# Patient Record
Sex: Male | Born: 1985 | Race: Black or African American | Hispanic: No | Marital: Single | Smoking: Never smoker
Health system: Southern US, Community
[De-identification: ages and names within clinical notes are randomized; demographics above are authoritative.]

## PROBLEM LIST (undated history)

## (undated) DIAGNOSIS — D5700 Hb-SS disease with crisis, unspecified: Secondary | ICD-10-CM

## (undated) HISTORY — PX: KNEE SURGERY: SHX244

---

## 2009-11-05 ENCOUNTER — Emergency Department (HOSPITAL_COMMUNITY): Admission: EM | Admit: 2009-11-05 | Discharge: 2009-11-05 | Payer: Self-pay | Admitting: Emergency Medicine

## 2010-08-12 LAB — DIFFERENTIAL
Basophils Absolute: 0 10*3/uL (ref 0.0–0.1)
Eosinophils Relative: 3 % (ref 0–5)
Lymphocytes Relative: 28 % (ref 12–46)
Neutro Abs: 8.8 10*3/uL — ABNORMAL HIGH (ref 1.7–7.7)
Neutrophils Relative %: 61 % (ref 43–77)

## 2010-08-12 LAB — URINALYSIS, ROUTINE W REFLEX MICROSCOPIC
Glucose, UA: NEGATIVE mg/dL
Ketones, ur: NEGATIVE mg/dL
Leukocytes, UA: NEGATIVE
Nitrite: NEGATIVE
Specific Gravity, Urine: 1.009 (ref 1.005–1.030)
Urobilinogen, UA: 1 mg/dL (ref 0.0–1.0)

## 2010-08-12 LAB — COMPREHENSIVE METABOLIC PANEL
AST: 82 U/L — ABNORMAL HIGH (ref 0–37)
Albumin: 4.5 g/dL (ref 3.5–5.2)
BUN: 9 mg/dL (ref 6–23)
CO2: 26 mEq/L (ref 19–32)
Calcium: 9.1 mg/dL (ref 8.4–10.5)
GFR calc non Af Amer: >60 mL/min (ref >60)
Glucose, Bld: 111 mg/dL — ABNORMAL HIGH (ref 70–99)
Potassium: 4 mEq/L (ref 3.5–5.1)
Sodium: 139 mEq/L (ref 135–145)

## 2010-08-12 LAB — CBC
MCHC: 35.3 g/dL (ref 30.0–36.0)
MCV: 99.1 fL (ref 78.0–100.0)
Platelets: 363 10*3/uL (ref 150–400)
RBC: 2.17 MIL/uL — ABNORMAL LOW (ref 4.22–5.81)
RDW: 25.4 % — ABNORMAL HIGH (ref 11.5–15.5)
WBC: 14.5 10*3/uL — ABNORMAL HIGH (ref 4.0–10.5)

## 2010-08-12 LAB — RETICULOCYTES: RBC.: 2.15 MIL/uL — ABNORMAL LOW (ref 4.22–5.81)

## 2010-08-12 LAB — URINE MICROSCOPIC-ADD ON

## 2016-03-23 ENCOUNTER — Emergency Department (HOSPITAL_COMMUNITY): Payer: Medicare Other

## 2016-03-23 ENCOUNTER — Emergency Department (HOSPITAL_COMMUNITY)
Admission: EM | Admit: 2016-03-23 | Discharge: 2016-03-23 | Disposition: A | Discharge status: Home / Self Care | Payer: Medicare Other | Attending: Emergency Medicine | Admitting: Emergency Medicine

## 2016-03-23 ENCOUNTER — Encounter (HOSPITAL_COMMUNITY): Payer: Self-pay

## 2016-03-23 DIAGNOSIS — D57219 Sickle-cell/Hb-C disease with crisis, unspecified: Secondary | ICD-10-CM | POA: Diagnosis not present

## 2016-03-23 DIAGNOSIS — D5700 Hb-SS disease with crisis, unspecified: Secondary | ICD-10-CM

## 2016-03-23 HISTORY — DX: Hb-SS disease with crisis, unspecified: D57.00

## 2016-03-23 LAB — CBC WITH DIFFERENTIAL/PLATELET
Basophils Absolute: 0.1 10*3/uL (ref 0.0–0.1)
Basophils Relative: 1 %
EOS PCT: 7 %
Eosinophils Absolute: 0.9 10*3/uL — ABNORMAL HIGH (ref 0.0–0.7)
HEMATOCRIT: 18.7 % — AB (ref 39.0–52.0)
HEMOGLOBIN: 6.5 g/dL — AB (ref 13.0–17.0)
LYMPHS ABS: 3.6 10*3/uL (ref 0.7–4.0)
LYMPHS PCT: 29 %
MCH: 31.1 pg (ref 26.0–34.0)
MCHC: 34.8 g/dL (ref 30.0–36.0)
MCV: 89.5 fL (ref 78.0–100.0)
MONOS PCT: 11 %
Monocytes Absolute: 1.4 10*3/uL — ABNORMAL HIGH (ref 0.1–1.0)
NEUTROS ABS: 6.5 10*3/uL (ref 1.7–7.7)
Neutrophils Relative %: 52 %
Platelets: 547 10*3/uL — ABNORMAL HIGH (ref 150–400)
RBC: 2.09 MIL/uL — AB (ref 4.22–5.81)
RDW: 22 % — AB (ref 11.5–15.5)
WBC: 12.5 10*3/uL — ABNORMAL HIGH (ref 4.0–10.5)

## 2016-03-23 LAB — COMPREHENSIVE METABOLIC PANEL
ALBUMIN: 3.4 g/dL — AB (ref 3.5–5.0)
ALT: 34 U/L (ref 17–63)
ANION GAP: 8 (ref 5–15)
AST: 71 U/L — AB (ref 15–41)
Alkaline Phosphatase: 65 U/L (ref 38–126)
BUN: 6 mg/dL (ref 6–20)
CHLORIDE: 105 mmol/L (ref 101–111)
CO2: 24 mmol/L (ref 22–32)
Calcium: 8.3 mg/dL — ABNORMAL LOW (ref 8.9–10.3)
Creatinine, Ser: 0.65 mg/dL (ref 0.61–1.24)
GFR calc Af Amer: >60 mL/min (ref >60)
GFR calc non Af Amer: >60 mL/min (ref >60)
GLUCOSE: 100 mg/dL — AB (ref 65–99)
POTASSIUM: 3.5 mmol/L (ref 3.5–5.1)
SODIUM: 137 mmol/L (ref 135–145)
Total Bilirubin: 4.7 mg/dL — ABNORMAL HIGH (ref 0.3–1.2)
Total Protein: 6.1 g/dL — ABNORMAL LOW (ref 6.5–8.1)

## 2016-03-23 LAB — RETICULOCYTES
RBC.: 2.09 MIL/uL — ABNORMAL LOW (ref 4.22–5.81)
Retic Ct Pct: >23 % — ABNORMAL HIGH (ref 0.4–3.1)

## 2016-03-23 MED ORDER — HEPARIN SOD (PORK) LOCK FLUSH 100 UNIT/ML IV SOLN
500.0000 [IU] | Freq: Once | INTRAVENOUS | Status: AC
  Administered 2016-03-23: 500 [IU]
  Filled 2016-03-23: qty 5

## 2016-03-23 MED ORDER — HYDROMORPHONE HCL 2 MG/ML IJ SOLN
2.0000 mg | Freq: Once | INTRAMUSCULAR | Status: AC
  Administered 2016-03-23: 2 mg via INTRAMUSCULAR
  Filled 2016-03-23: qty 1

## 2016-03-23 MED ORDER — HYDROMORPHONE HCL 2 MG/ML IJ SOLN
2.0000 mg | Freq: Once | INTRAMUSCULAR | Status: AC
  Administered 2016-03-23: 2 mg via INTRAVENOUS
  Filled 2016-03-23: qty 1

## 2016-03-23 MED ORDER — DIPHENHYDRAMINE HCL 50 MG/ML IJ SOLN
25.0000 mg | Freq: Once | INTRAMUSCULAR | Status: AC
  Administered 2016-03-23: 25 mg via INTRAVENOUS
  Filled 2016-03-23: qty 1

## 2016-03-23 MED ORDER — HYDROMORPHONE HCL 2 MG/ML IJ SOLN
2.0000 mg | Freq: Once | INTRAMUSCULAR | Status: DC
Start: 2016-03-23 — End: 2016-03-23
  Filled 2016-03-23: qty 1

## 2016-03-23 MED ORDER — SODIUM CHLORIDE 0.9 % IV BOLUS (SEPSIS)
1000.0000 mL | Freq: Once | INTRAVENOUS | Status: AC
  Administered 2016-03-23: 1000 mL via INTRAVENOUS

## 2016-03-23 MED ORDER — KETOROLAC TROMETHAMINE 30 MG/ML IJ SOLN
30.0000 mg | Freq: Once | INTRAMUSCULAR | Status: AC
  Administered 2016-03-23: 30 mg via INTRAVENOUS
  Filled 2016-03-23: qty 1

## 2016-03-23 MED ORDER — HYDROMORPHONE HCL 2 MG/ML IJ SOLN
2.0000 mg | Freq: Once | INTRAMUSCULAR | Status: AC
  Administered 2016-03-23: 2 mg via INTRAVENOUS

## 2016-03-23 NOTE — Discharge Instructions (Signed)
Follow-up with your doctor in [place] when you return home.

## 2016-03-23 NOTE — ED Notes (Signed)
No lab draw,  Pt wants to wait for port access.

## 2016-03-23 NOTE — ED Notes (Signed)
Papers reviewed and patient leaving with minimal pain with family

## 2016-03-23 NOTE — ED Notes (Signed)
Pt needs chest xray for confirmation placement of port. Called xray to make aware need ASAP, reports 1 person in front of him.

## 2016-03-23 NOTE — ED Notes (Signed)
Port-a-cath access by Eleanora NeighborEric B, RN.

## 2016-03-23 NOTE — ED Provider Notes (Signed)
[facility]EM[facility]ENCY DEPT Provider Note   CSN: [id number] Arrival date & time: 03/23/16  1034     History   Chief Complaint Chief Complaint  Patient presents with  . Sickle Cell Pain Crisis    HPI Eric Rivera is a 30 y.o. male.  Patient is a 30 year old male with past medical history of sickle cell disease. He presents for evaluation of pain in his chest, back, and joints he states is a sickle cell crisis. This pain began yesterday and has worsened. He denies any fevers or chills. He denies any difficulty breathing or productive cough.  Patient is here visiting from [place] for a wedding.      Past Medical History:  Diagnosis Date  . Sickle cell crisis (HCC)     There are no active problems to display for this patient.   History reviewed. No pertinent surgical history.     Home Medications    Prior to Admission medications   Not on File    Family History No family history on file.  Social History Social History  Substance Use Topics  . Smoking status: Never Smoker  . Smokeless tobacco: Not on file  . Alcohol use Not on file     Allergies   Review of patient's allergies indicates no known allergies.   Review of Systems Review of Systems  All other systems reviewed and are negative.    Physical Exam Updated Vital Signs BP 125/84 (BP Location: Right Arm)   Pulse 92   Temp 98.5 F (36.9 C) (Oral)   Resp 16   Ht 6\' 4"  (1.93 m)   Wt 205 lb (93 kg)   SpO2 95%   BMI 24.95 kg/m   Physical Exam  Constitutional: He is oriented to person, place, and time. He appears well-developed and well-nourished.  Patient appears anxious and tearful.  HENT:  Head: Normocephalic and atraumatic.  Mouth/Throat: Oropharynx is clear and moist.  Neck: Normal range of motion. Neck supple.  Cardiovascular: Normal rate and regular rhythm.  Exam reveals no friction rub.   No murmur heard. Pulmonary/Chest: Effort normal and breath sounds normal. No respiratory  distress. He has no wheezes. He has no rales.  Abdominal: Soft. Bowel sounds are normal. He exhibits no distension. There is no tenderness.  Musculoskeletal: Normal range of motion. He exhibits no edema.  Neurological: He is alert and oriented to person, place, and time. Coordination normal.  Skin: Skin is warm and dry. He is not diaphoretic.  Nursing note and vitals reviewed.    ED Treatments / Results  Labs (all labs ordered are listed, but only abnormal results are displayed) Labs Reviewed  COMPREHENSIVE METABOLIC PANEL  CBC WITH DIFFERENTIAL/PLATELET  RETICULOCYTES    EKG  EKG Interpretation None       Radiology No results found.  Procedures Procedures (including critical care time)  Medications Ordered in ED Medications  diphenhydrAMINE (BENADRYL) injection 25 mg (not administered)  HYDROmorphone (DILAUDID) injection 2 mg (not administered)  sodium chloride 0.9 % bolus 1,000 mL (not administered)  HYDROmorphone (DILAUDID) injection 2 mg (not administered)     Initial Impression / Assessment and Plan / ED Course  I have reviewed the triage vital signs and the nursing notes.  Pertinent labs & imaging results that were available during my care of the patient were reviewed by me and considered in my medical decision making (see chart for details).  Clinical Course    Patient feeling better after repeat doses of Dilaudid. His laboratory studies  reveal anemia with a hemoglobin of 6.5 and elevated reticulocyte count. His chest x-ray is clear. He will be discharged, to follow-up with his doctors in [place] where he is from.  Final Clinical Impressions(s) / ED Diagnoses   Final diagnoses:  None    New Prescriptions New Prescriptions   No medications on file     Geoffery Lyonsouglas Maxxon Schwanke, MD 03/23/16 1438

## 2016-03-23 NOTE — ED Triage Notes (Signed)
Patient here with acute pain that started this am due to sickle cell crisis that awoke him from sleep. Here from out of state for wedding. Alert and oriented, no other hx

## 2016-03-23 NOTE — ED Notes (Signed)
Patient transported to X-ray 

## 2016-03-23 NOTE — ED Notes (Signed)
[place]le [place] crisis. [place] a port from [place].

## 2018-04-22 ENCOUNTER — Inpatient Hospital Stay (HOSPITAL_COMMUNITY)
Admission: EM | Admit: 2018-04-22 | Discharge: 2018-04-30 | DRG: 812 | Disposition: A | Discharge status: Home / Self Care | Payer: Medicare (Managed Care) | Attending: Internal Medicine | Admitting: Internal Medicine

## 2018-04-22 ENCOUNTER — Emergency Department (HOSPITAL_COMMUNITY): Payer: Medicare (Managed Care)

## 2018-04-22 ENCOUNTER — Encounter (HOSPITAL_COMMUNITY): Payer: Self-pay | Admitting: *Deleted

## 2018-04-22 ENCOUNTER — Other Ambulatory Visit: Payer: Self-pay

## 2018-04-22 DIAGNOSIS — R079 Chest pain, unspecified: Secondary | ICD-10-CM | POA: Diagnosis present

## 2018-04-22 DIAGNOSIS — Z79899 Other long term (current) drug therapy: Secondary | ICD-10-CM

## 2018-04-22 DIAGNOSIS — K5900 Constipation, unspecified: Secondary | ICD-10-CM | POA: Diagnosis present

## 2018-04-22 DIAGNOSIS — D571 Sickle-cell disease without crisis: Secondary | ICD-10-CM | POA: Diagnosis present

## 2018-04-22 DIAGNOSIS — D5701 Hb-SS disease with acute chest syndrome: Secondary | ICD-10-CM

## 2018-04-22 DIAGNOSIS — E876 Hypokalemia: Secondary | ICD-10-CM | POA: Diagnosis present

## 2018-04-22 DIAGNOSIS — E860 Dehydration: Secondary | ICD-10-CM | POA: Diagnosis present

## 2018-04-22 DIAGNOSIS — R0789 Other chest pain: Secondary | ICD-10-CM | POA: Diagnosis not present

## 2018-04-22 DIAGNOSIS — G894 Chronic pain syndrome: Secondary | ICD-10-CM | POA: Diagnosis present

## 2018-04-22 DIAGNOSIS — L97919 Non-pressure chronic ulcer of unspecified part of right lower leg with unspecified severity: Secondary | ICD-10-CM | POA: Diagnosis present

## 2018-04-22 DIAGNOSIS — D5700 Hb-SS disease with crisis, unspecified: Secondary | ICD-10-CM

## 2018-04-22 DIAGNOSIS — L97929 Non-pressure chronic ulcer of unspecified part of left lower leg with unspecified severity: Secondary | ICD-10-CM | POA: Diagnosis present

## 2018-04-22 DIAGNOSIS — D72829 Elevated white blood cell count, unspecified: Secondary | ICD-10-CM | POA: Diagnosis present

## 2018-04-22 DIAGNOSIS — Z832 Family history of diseases of the blood and blood-forming organs and certain disorders involving the immune mechanism: Secondary | ICD-10-CM

## 2018-04-22 DIAGNOSIS — N179 Acute kidney failure, unspecified: Secondary | ICD-10-CM | POA: Diagnosis present

## 2018-04-22 DIAGNOSIS — D638 Anemia in other chronic diseases classified elsewhere: Secondary | ICD-10-CM | POA: Diagnosis present

## 2018-04-22 LAB — CBC WITH DIFFERENTIAL/PLATELET
Abs Immature Granulocytes: 0.33 10*3/uL — ABNORMAL HIGH (ref 0.00–0.07)
Basophils Absolute: 0.1 10*3/uL (ref 0.0–0.1)
Basophils Relative: 1 %
Eosinophils Absolute: 3.1 10*3/uL — ABNORMAL HIGH (ref 0.0–0.5)
Eosinophils Relative: 15 %
HEMATOCRIT: 15.2 % — AB (ref 39.0–52.0)
Hemoglobin: 4.9 g/dL — CL (ref 13.0–17.0)
Immature Granulocytes: 2 %
Lymphocytes Relative: 18 %
Lymphs Abs: 3.7 10*3/uL (ref 0.7–4.0)
MCH: 30.6 pg (ref 26.0–34.0)
MCHC: 32.2 g/dL (ref 30.0–36.0)
MCV: 95 fL (ref 80.0–100.0)
Monocytes Absolute: 1.2 10*3/uL — ABNORMAL HIGH (ref 0.1–1.0)
Monocytes Relative: 6 %
NEUTROS ABS: 12.5 10*3/uL — AB (ref 1.7–7.7)
Neutrophils Relative %: 58 %
Platelets: 460 10*3/uL — ABNORMAL HIGH (ref 150–400)
RBC: 1.6 MIL/uL — ABNORMAL LOW (ref 4.22–5.81)
RDW: 23 % — AB (ref 11.5–15.5)
WBC: 21.1 10*3/uL — ABNORMAL HIGH (ref 4.0–10.5)
nRBC: 2 % — ABNORMAL HIGH (ref 0.0–0.2)

## 2018-04-22 LAB — I-STAT TROPONIN, ED: Troponin i, poc: 0.03 ng/mL (ref 0.00–0.08)

## 2018-04-22 LAB — COMPREHENSIVE METABOLIC PANEL
ALT: 20 U/L (ref 0–44)
AST: 41 U/L (ref 15–41)
Albumin: 1.8 g/dL — ABNORMAL LOW (ref 3.5–5.0)
Alkaline Phosphatase: 51 U/L (ref 38–126)
Anion gap: 7 (ref 5–15)
BUN: 11 mg/dL (ref 6–20)
CHLORIDE: 114 mmol/L — AB (ref 98–111)
CO2: 19 mmol/L — ABNORMAL LOW (ref 22–32)
Calcium: 6.9 mg/dL — ABNORMAL LOW (ref 8.9–10.3)
Creatinine, Ser: 1.19 mg/dL (ref 0.61–1.24)
GFR calc Af Amer: >60 mL/min (ref >60)
GFR calc non Af Amer: >60 mL/min (ref >60)
GLUCOSE: 103 mg/dL — AB (ref 70–99)
Potassium: 3.2 mmol/L — ABNORMAL LOW (ref 3.5–5.1)
Sodium: 140 mmol/L (ref 135–145)
TOTAL PROTEIN: 4.9 g/dL — AB (ref 6.5–8.1)
Total Bilirubin: 1.8 mg/dL — ABNORMAL HIGH (ref 0.3–1.2)

## 2018-04-22 LAB — RETICULOCYTES
Immature Retic Fract: 49.2 % — ABNORMAL HIGH (ref 2.3–15.9)
RBC.: 1.6 MIL/uL — ABNORMAL LOW (ref 4.22–5.81)
Retic Count, Absolute: 332.3 10*3/uL — ABNORMAL HIGH (ref 19.0–186.0)
Retic Ct Pct: 20.8 % — ABNORMAL HIGH (ref 0.4–3.1)

## 2018-04-22 MED ORDER — HYDROMORPHONE HCL 1 MG/ML IJ SOLN: 2.0000 mg | INTRAMUSCULAR | Status: DC

## 2018-04-22 MED ORDER — DIPHENHYDRAMINE HCL 25 MG PO CAPS
25.0000 mg | ORAL_CAPSULE | ORAL | Status: DC | PRN
  Administered 2018-04-22: 25 mg via ORAL
  Filled 2018-04-22: qty 1

## 2018-04-22 MED ORDER — HYDROMORPHONE HCL 1 MG/ML IJ SOLN
2.0000 mg | INTRAMUSCULAR | Status: AC
  Administered 2018-04-23: 2 mg via INTRAVENOUS
  Filled 2018-04-22: qty 2

## 2018-04-22 MED ORDER — HYDROMORPHONE HCL 1 MG/ML IJ SOLN: 2.0000 mg | INTRAMUSCULAR | Status: AC

## 2018-04-22 MED ORDER — HYDROMORPHONE HCL 1 MG/ML IJ SOLN
2.0000 mg | INTRAMUSCULAR | Status: AC
  Administered 2018-04-22: 2 mg via INTRAVENOUS
  Filled 2018-04-22: qty 2

## 2018-04-22 MED ORDER — HYDROMORPHONE HCL 1 MG/ML IJ SOLN
2.0000 mg | INTRAMUSCULAR | Status: DC
  Administered 2018-04-23: 2 mg via INTRAVENOUS
  Filled 2018-04-22: qty 2

## 2018-04-22 MED ORDER — IOPAMIDOL (ISOVUE-370) INJECTION 76%
INTRAVENOUS | Status: AC
  Filled 2018-04-22: qty 100

## 2018-04-22 MED ORDER — PROMETHAZINE HCL 25 MG PO TABS: 25.0000 mg | ORAL_TABLET | ORAL | Status: DC | PRN

## 2018-04-22 MED ORDER — SODIUM CHLORIDE 0.45 % IV SOLN
INTRAVENOUS | Status: DC
  Administered 2018-04-22: 150 mL/h via INTRAVENOUS

## 2018-04-22 MED ORDER — IOPAMIDOL (ISOVUE-370) INJECTION 76%
100.0000 mL | Freq: Once | INTRAVENOUS | Status: AC | PRN
  Administered 2018-04-22: 100 mL via INTRAVENOUS

## 2018-04-22 MED ORDER — KETOROLAC TROMETHAMINE 30 MG/ML IJ SOLN
30.0000 mg | Freq: Once | INTRAMUSCULAR | Status: AC
  Administered 2018-04-23: 30 mg via INTRAVENOUS
  Filled 2018-04-22: qty 1

## 2018-04-22 NOTE — ED Provider Notes (Signed)
[facility] EMERGENCY DEPARTMENT Provider Note   CSN: [id number] Arrival date & time: 04/22/18  2200     History   Chief Complaint Chief Complaint  Patient presents with  . Chest Pain  . Sickle Cell Pain Crisis    HPI Eric Rivera is a 32 y.o. male.  Patient presents to the emergency department with a chief complaint of chest pain and shortness of breath.  He is accompanied by his parents.  They were traveling from [place].  Patient has a history of sickle cell disease.  Mother reports that he has sickle cell crisis a couple of times a month.  She states that the onset of this episode was sudden.  She states that he felt like he had a low-grade fever earlier today and gave him some Advil.  When she went to recheck on him he was crying in the bed complaining of chest pain or shortness of breath.  Denies any history of PE or DVT.  He is not anticoagulated.  He denies any pain in his joints.  He states that he has had some slight cough.  The history is provided by the patient. No language interpreter was used.    Past Medical History:  Diagnosis Date  . Sickle cell crisis (HCC)     There are no active problems to display for this patient.   History reviewed. No pertinent surgical history.      Home Medications    Prior to Admission medications   Not on File    Family History No family history on file.  Social History Social History   Tobacco Use  . Smoking status: Never Smoker  Substance Use Topics  . Alcohol use: Not on file  . Drug use: Not on file     Allergies   Patient has no known allergies.   Review of Systems Review of Systems  All other systems reviewed and are negative.    Physical Exam Updated Vital Signs BP (!) 142/76 (BP Location: Right Arm)   Pulse (!) 103   Temp 100.3 F (37.9 C) (Oral)   Resp (!) 24   Ht 6\' 2"  (1.88 m)   Wt 102.1 kg   BMI 28.89 kg/m   Physical Exam  Constitutional: He is oriented to  person, place, and time. He appears well-developed and well-nourished.  Appears uncomfortable  HENT:  Head: Normocephalic and atraumatic.  Eyes: Pupils are equal, round, and reactive to light. Conjunctivae and EOM are normal. Right eye exhibits no discharge. Left eye exhibits no discharge. No scleral icterus.  Neck: Normal range of motion. Neck supple. No JVD present.  Cardiovascular: Normal rate, regular rhythm and normal heart sounds. Exam reveals no gallop and no friction rub.  No murmur heard. Pulmonary/Chest: Effort normal and breath sounds normal. No respiratory distress. He has no wheezes. He has no rales. He exhibits no tenderness.  Lung sounds are clear  Abdominal: Soft. He exhibits no distension and no mass. There is no tenderness. There is no rebound and no guarding.  Musculoskeletal: Normal range of motion. He exhibits no edema or tenderness.  Neurological: He is alert and oriented to person, place, and time.  Skin: Skin is warm and dry.  Psychiatric: He has a normal mood and affect. His behavior is normal. Judgment and thought content normal.  Nursing note and vitals reviewed.    ED Treatments / Results  Labs (all labs ordered are listed, but only abnormal results are displayed) Labs Reviewed  RETICULOCYTES - Abnormal; Notable for the following components:      Result Value   Retic Ct Pct 20.8 (*)    RBC. 1.60 (*)    Retic Count, Absolute 332.3 (*)    Immature Retic Fract 49.2 (*)    All other components within normal limits  COMPREHENSIVE METABOLIC PANEL - Abnormal; Notable for the following components:   Potassium 3.2 (*)    Chloride 114 (*)    CO2 19 (*)    Glucose, Bld 103 (*)    Calcium 6.9 (*)    Total Protein 4.9 (*)    Albumin 1.8 (*)    Total Bilirubin 1.8 (*)    All other components within normal limits  CBC WITH DIFFERENTIAL/PLATELET - Abnormal; Notable for the following components:   WBC 21.1 (*)    RBC 1.60 (*)    Hemoglobin 4.9 (*)    HCT 15.2  (*)    RDW 23.0 (*)    Platelets 460 (*)    nRBC 2.0 (*)    Neutro Abs 12.5 (*)    Monocytes Absolute 1.2 (*)    Eosinophils Absolute 3.1 (*)    Abs Immature Granulocytes 0.33 (*)    All other components within normal limits  HIV ANTIBODY (ROUTINE TESTING W REFLEX)  CBC  CREATININE, SERUM  LACTATE DEHYDROGENASE  I-STAT TROPONIN, ED  TYPE AND SCREEN  ABO/RH  PREPARE RBC (CROSSMATCH)    EKG EKG Interpretation  Date/Time:  Thursday April 22 2018 22:05:56 EST Ventricular Rate:  104 PR Interval:    QRS Duration: 93 QT Interval:  338 QTC Calculation: 445 R Axis:   73 Text Interpretation:  Sinus tachycardia Probable left ventricular hypertrophy Nonspecific T abnormalities, inferior leads Confirmed by Geoffery Lyons ([id number]) on 04/22/2018 10:39:12 PM   Radiology Ct Angio Chest Pe W And/or Wo Contrast  Result Date: 04/23/2018 CLINICAL DATA:  Chest pain and shortness of breath. High pretest probability for pulmonary embolism. Sickle cell anemia. EXAM: CT ANGIOGRAPHY CHEST WITH CONTRAST TECHNIQUE: Multidetector CT imaging of the chest was performed using the standard protocol during bolus administration of intravenous contrast. Multiplanar CT image reconstructions and MIPs were obtained to evaluate the vascular anatomy. CONTRAST:  ISOVUE-370 IOPAMIDOL (ISOVUE-370) INJECTION 76% COMPARISON:  None FINDINGS: Cardiovascular: Somewhat suboptimal opacification of pulmonary arteries noted, however no pulmonary emboli identified. No evidence of thoracic aortic dissection or aneurysm. Mild-to-moderate cardiomegaly. Mediastinum/Nodes: Mild mediastinal lymphadenopathy is seen in the right paratracheal region and AP window. Largest lymph node measures 1.6 cm in the AP window on image 49/5. No hilar lymphadenopathy identified. Lungs/Pleura: No pulmonary mass, infiltrate, or effusion. Upper abdomen: No acute findings. Musculoskeletal: No suspicious bone lesions identified. Review of the MIP images  confirms the above findings. IMPRESSION: No evidence of pulmonary embolism or active lung disease. Mild mediastinal lymphadenopathy, which is nonspecific. Recommend continued follow-up by chest CT in 3-6 months. This recommendation follows ACR consensus guidelines: Managing Incidental Findings on Thoracic CT: Mediastinal and Cardiovascular Findings. A White Paper of the ACR Incidental Findings Committee. J Am Coll Radiol. 2018; 15: 9562-1308. Mild-to-moderate cardiomegaly. Electronically Signed   By: Myles Rosenthal M.D.   On: 04/23/2018 00:29   Dg Chest Port 1 View  Result Date: 04/22/2018 CLINICAL DATA:  Chest pain and shortness of breath. EXAM: PORTABLE CHEST 1 VIEW COMPARISON:  Frontal and lateral views 05/23/2016 FINDINGS: Left chest port with tip in the mid SVC. Cardiomegaly with question progression from prior exam versus differences in technique. Perihilar atelectasis and mild  bronchial thickening. No confluent airspace disease. No pleural effusion or pneumothorax. No acute osseous abnormalities. IMPRESSION: 1. Cardiomegaly with question of progression from prior exam versus differences in technique. 2. Perihilar atelectasis and bronchial thickening. Electronically Signed   By: Narda Rutherford M.D.   On: 04/22/2018 22:26    Procedures Procedures (including critical care time) CRITICAL CARE Performed by: Roxy Horseman  Sickle cell crisis, multiple re-evaluations, concern for acute chest syndrome, profound anemia, hemoglobin 4.9, requiring blood transfusion Total critical care time: 49 minutes  Critical care time was exclusive of separately billable procedures and treating other patients.  Critical care was necessary to treat or prevent imminent or life-threatening deterioration.  Critical care was time spent personally by me on the following activities: development of treatment plan with patient and/or surrogate as well as nursing, discussions with consultants, evaluation of patient's  response to treatment, examination of patient, obtaining history from patient or surrogate, ordering and performing treatments and interventions, ordering and review of laboratory studies, ordering and review of radiographic studies, pulse oximetry and re-evaluation of patient's condition.  Medications Ordered in ED Medications  0.45 % sodium chloride infusion (has no administration in time range)  HYDROmorphone (DILAUDID) injection 2 mg (has no administration in time range)    Or  HYDROmorphone (DILAUDID) injection 2 mg (has no administration in time range)  HYDROmorphone (DILAUDID) injection 2 mg (has no administration in time range)    Or  HYDROmorphone (DILAUDID) injection 2 mg (has no administration in time range)  HYDROmorphone (DILAUDID) injection 2 mg (has no administration in time range)    Or  HYDROmorphone (DILAUDID) injection 2 mg (has no administration in time range)  HYDROmorphone (DILAUDID) injection 2 mg (has no administration in time range)    Or  HYDROmorphone (DILAUDID) injection 2 mg (has no administration in time range)  diphenhydrAMINE (BENADRYL) capsule 25-50 mg (has no administration in time range)  promethazine (PHENERGAN) tablet 25 mg (has no administration in time range)     Initial Impression / Assessment and Plan / ED Course  I have reviewed the triage vital signs and the nursing notes.  Pertinent labs & imaging results that were available during my care of the patient were reviewed by me and considered in my medical decision making (see chart for details).     Patient with chest pain shortness of breath.  Noted to be mildly febrile.  Slightly tachycardic.  O2 saturation is 95% on room air.  Patient reports feeling very short of breath and states that his pain is severe.  He is at risk for PE given his recent travel from [place].  Given his sickle cell disease and risk factors, I believe him to be high risk, will proceed with CT imaging of his chest.   Will give pain medicines and fluids, and will reassess.  11:04 PM Patient discussed with Dr. Judd Lien.  Patient family requesting 4mg  doses of dilaudid. Dr. Judd Lien recommends 2 mg doses.  Hemoglobin is 4.9.  I discussed the he would likely benefit from blood transfusion.  Currently, he wishes to think about this, and does not give consent at this moment.  Patient reassessed, still having significant pain.  Agreeable with plan for blood transfusion.  CT negative for PE.  I am still concerned for radioccult infection given increased leukocytosis, mildly elevated temperature, and cough.  Patient discussed with Dr. Toniann Fail, who will admit the patient.  Agrees with plan to start broad-spectrum antibiotics and transfused 2 units.  Final Clinical Impressions(s) /  ED Diagnoses   Final diagnoses:  Sickle cell pain crisis [facility])  Acute chest syndrome [facility])    ED Discharge Orders    None       Roxy Horseman, PA-C 04/23/18 0143    Geoffery Lyons, MD 04/23/18 1806

## 2018-04-22 NOTE — ED Notes (Signed)
Patient transported to CT 

## 2018-04-22 NOTE — ED Triage Notes (Signed)
Pt c/o chest pain and sob, crying in bed, family member helping to answer questions. Pt took ibuprofen x2 without relief. Power port noted to L chest

## 2018-04-23 ENCOUNTER — Encounter (HOSPITAL_COMMUNITY): Payer: Self-pay | Admitting: Internal Medicine

## 2018-04-23 ENCOUNTER — Other Ambulatory Visit: Payer: Self-pay

## 2018-04-23 DIAGNOSIS — L97929 Non-pressure chronic ulcer of unspecified part of left lower leg with unspecified severity: Secondary | ICD-10-CM | POA: Diagnosis present

## 2018-04-23 DIAGNOSIS — E860 Dehydration: Secondary | ICD-10-CM | POA: Diagnosis present

## 2018-04-23 DIAGNOSIS — D571 Sickle-cell disease without crisis: Secondary | ICD-10-CM | POA: Diagnosis present

## 2018-04-23 DIAGNOSIS — K5900 Constipation, unspecified: Secondary | ICD-10-CM | POA: Diagnosis present

## 2018-04-23 DIAGNOSIS — G894 Chronic pain syndrome: Secondary | ICD-10-CM | POA: Diagnosis present

## 2018-04-23 DIAGNOSIS — D72829 Elevated white blood cell count, unspecified: Secondary | ICD-10-CM | POA: Diagnosis present

## 2018-04-23 DIAGNOSIS — R0789 Other chest pain: Secondary | ICD-10-CM | POA: Diagnosis present

## 2018-04-23 DIAGNOSIS — Z79899 Other long term (current) drug therapy: Secondary | ICD-10-CM | POA: Diagnosis not present

## 2018-04-23 DIAGNOSIS — D638 Anemia in other chronic diseases classified elsewhere: Secondary | ICD-10-CM | POA: Diagnosis present

## 2018-04-23 DIAGNOSIS — N179 Acute kidney failure, unspecified: Secondary | ICD-10-CM | POA: Diagnosis present

## 2018-04-23 DIAGNOSIS — Z832 Family history of diseases of the blood and blood-forming organs and certain disorders involving the immune mechanism: Secondary | ICD-10-CM | POA: Diagnosis not present

## 2018-04-23 DIAGNOSIS — R079 Chest pain, unspecified: Secondary | ICD-10-CM | POA: Diagnosis not present

## 2018-04-23 DIAGNOSIS — E876 Hypokalemia: Secondary | ICD-10-CM | POA: Diagnosis present

## 2018-04-23 DIAGNOSIS — D5700 Hb-SS disease with crisis, unspecified: Secondary | ICD-10-CM | POA: Diagnosis present

## 2018-04-23 DIAGNOSIS — D5701 Hb-SS disease with acute chest syndrome: Secondary | ICD-10-CM | POA: Diagnosis present

## 2018-04-23 DIAGNOSIS — L97919 Non-pressure chronic ulcer of unspecified part of right lower leg with unspecified severity: Secondary | ICD-10-CM | POA: Diagnosis present

## 2018-04-23 LAB — TYPE AND SCREEN
ABO/RH(D): O POS
ANTIBODY SCREEN: POSITIVE
DAT, IgG: POSITIVE
Unit division: 0
Unit division: 0

## 2018-04-23 LAB — BPAM RBC
Blood Product Expiration Date: 201912212359
Blood Product Expiration Date: 201912232359
Unit Type and Rh: 5100
Unit Type and Rh: 5100

## 2018-04-23 LAB — HIV ANTIBODY (ROUTINE TESTING W REFLEX): HIV Screen 4th Generation wRfx: NONREACTIVE

## 2018-04-23 LAB — PREPARE RBC (CROSSMATCH)

## 2018-04-23 LAB — CBC
HCT: 17.6 % — ABNORMAL LOW (ref 39.0–52.0)
Hemoglobin: 5.4 g/dL — CL (ref 13.0–17.0)
MCH: 29.3 pg (ref 26.0–34.0)
MCHC: 30.7 g/dL (ref 30.0–36.0)
MCV: 95.7 fL (ref 80.0–100.0)
Platelets: 522 10*3/uL — ABNORMAL HIGH (ref 150–400)
RBC: 1.84 MIL/uL — ABNORMAL LOW (ref 4.22–5.81)
RDW: 23.2 % — ABNORMAL HIGH (ref 11.5–15.5)
WBC: 21.9 10*3/uL — ABNORMAL HIGH (ref 4.0–10.5)
nRBC: 2.3 % — ABNORMAL HIGH (ref 0.0–0.2)

## 2018-04-23 LAB — CREATININE, SERUM
Creatinine, Ser: 1.19 mg/dL (ref 0.61–1.24)
GFR calc non Af Amer: >60 mL/min (ref >60)

## 2018-04-23 LAB — ABO/RH: ABO/RH(D): O POS

## 2018-04-23 LAB — LACTATE DEHYDROGENASE: LDH: 311 U/L — AB (ref 98–192)

## 2018-04-23 MED ORDER — SODIUM CHLORIDE 0.9 % IV SOLN
50.0000 mg | Freq: Once | INTRAVENOUS | Status: AC
  Administered 2018-04-23: 50 mg via INTRAVENOUS
  Filled 2018-04-23: qty 1

## 2018-04-23 MED ORDER — KETOROLAC TROMETHAMINE 15 MG/ML IJ SOLN
15.0000 mg | Freq: Four times a day (QID) | INTRAMUSCULAR | Status: DC
  Administered 2018-04-23: 15 mg via INTRAVENOUS
  Filled 2018-04-23: qty 1

## 2018-04-23 MED ORDER — HYDROMORPHONE 1 MG/ML IV SOLN
INTRAVENOUS | Status: DC
  Administered 2018-04-23: 25 mg via INTRAVENOUS
  Administered 2018-04-23: 30 mg via INTRAVENOUS
  Administered 2018-04-23: 3 mg via INTRAVENOUS
  Administered 2018-04-23: 09:00:00 via INTRAVENOUS
  Administered 2018-04-23: 16 mg via INTRAVENOUS
  Administered 2018-04-23: 7.43 mg via INTRAVENOUS
  Administered 2018-04-24: 5.54 mg via INTRAVENOUS
  Administered 2018-04-24: 9 mg via INTRAVENOUS
  Administered 2018-04-24: 4 mg via INTRAVENOUS
  Administered 2018-04-24: 18:00:00 via INTRAVENOUS
  Administered 2018-04-24: 6 mg via INTRAVENOUS
  Administered 2018-04-24: 7 mg via INTRAVENOUS
  Administered 2018-04-24: 18 mg via INTRAVENOUS
  Administered 2018-04-24: 4.3 mg via INTRAVENOUS
  Administered 2018-04-25: 13:00:00 via INTRAVENOUS
  Administered 2018-04-25: 2 mg via INTRAVENOUS
  Administered 2018-04-25: 19:00:00 via INTRAVENOUS
  Administered 2018-04-26: 10.7 mg via INTRAVENOUS
  Administered 2018-04-26: 18:00:00 via INTRAVENOUS
  Administered 2018-04-26: 13 mg via INTRAVENOUS
  Administered 2018-04-26: 12.5 mg via INTRAVENOUS
  Administered 2018-04-26: 9.3 mg via INTRAVENOUS
  Administered 2018-04-26: 14 mg via INTRAVENOUS
  Administered 2018-04-26 – 2018-04-27 (×2): via INTRAVENOUS
  Administered 2018-04-27: 30 mg via INTRAVENOUS
  Administered 2018-04-27: 15 mg via INTRAVENOUS
  Administered 2018-04-27: 1 mg via INTRAVENOUS
  Filled 2018-04-23 (×16): qty 25

## 2018-04-23 MED ORDER — VITAMIN D 25 MCG (1000 UNIT) PO TABS
2000.0000 [IU] | ORAL_TABLET | Freq: Every day | ORAL | Status: DC
  Administered 2018-04-23 – 2018-04-30 (×8): 2000 [IU] via ORAL
  Filled 2018-04-23 (×8): qty 2

## 2018-04-23 MED ORDER — SODIUM CHLORIDE 0.9% IV SOLUTION: Freq: Once | INTRAVENOUS | Status: DC

## 2018-04-23 MED ORDER — SENNOSIDES-DOCUSATE SODIUM 8.6-50 MG PO TABS
1.0000 | ORAL_TABLET | Freq: Two times a day (BID) | ORAL | Status: DC
  Administered 2018-04-23 – 2018-04-30 (×16): 1 via ORAL
  Filled 2018-04-23 (×16): qty 1

## 2018-04-23 MED ORDER — FOLIC ACID 1 MG PO TABS
1.0000 mg | ORAL_TABLET | Freq: Every day | ORAL | Status: DC
  Administered 2018-04-23 – 2018-04-30 (×8): 1 mg via ORAL
  Filled 2018-04-23 (×8): qty 1

## 2018-04-23 MED ORDER — SODIUM CHLORIDE 0.9% FLUSH
10.0000 mL | INTRAVENOUS | Status: DC | PRN
  Administered 2018-04-27: 10 mL
  Filled 2018-04-23: qty 40

## 2018-04-23 MED ORDER — DIPHENHYDRAMINE HCL 25 MG PO CAPS
25.0000 mg | ORAL_CAPSULE | ORAL | Status: DC | PRN
  Administered 2018-04-23 – 2018-04-28 (×9): 25 mg via ORAL
  Filled 2018-04-23 (×9): qty 1

## 2018-04-23 MED ORDER — VANCOMYCIN HCL 10 G IV SOLR
2000.0000 mg | Freq: Once | INTRAVENOUS | Status: AC
  Administered 2018-04-23: 2000 mg via INTRAVENOUS
  Filled 2018-04-23: qty 2000

## 2018-04-23 MED ORDER — SODIUM CHLORIDE 0.9 % IV SOLN
25.0000 mg | INTRAVENOUS | Status: DC | PRN
  Administered 2018-04-25 – 2018-04-28 (×2): 25 mg via INTRAVENOUS
  Filled 2018-04-23 (×2): qty 0.5
  Filled 2018-04-23: qty 25
  Filled 2018-04-23: qty 0.5

## 2018-04-23 MED ORDER — SODIUM CHLORIDE 0.9% FLUSH
10.0000 mL | INTRAVENOUS | Status: DC | PRN
  Administered 2018-04-28 – 2018-04-30 (×2): 10 mL
  Administered 2018-04-30: 20 mL
  Filled 2018-04-23 (×3): qty 40

## 2018-04-23 MED ORDER — PIPERACILLIN-TAZOBACTAM 3.375 G IVPB
3.3750 g | Freq: Three times a day (TID) | INTRAVENOUS | Status: DC
  Administered 2018-04-23 – 2018-04-26 (×9): 3.375 g via INTRAVENOUS
  Filled 2018-04-23 (×11): qty 50

## 2018-04-23 MED ORDER — SODIUM CHLORIDE 0.9% FLUSH: 9.0000 mL | INTRAVENOUS | Status: DC | PRN

## 2018-04-23 MED ORDER — METHADONE HCL 10 MG PO TABS
10.0000 mg | ORAL_TABLET | Freq: Every day | ORAL | Status: DC
  Administered 2018-04-24 – 2018-04-30 (×7): 10 mg via ORAL
  Filled 2018-04-23 (×7): qty 2

## 2018-04-23 MED ORDER — POLYETHYLENE GLYCOL 3350 17 G PO PACK: 17.0000 g | PACK | Freq: Every day | ORAL | Status: DC | PRN

## 2018-04-23 MED ORDER — ENOXAPARIN SODIUM 40 MG/0.4ML ~~LOC~~ SOLN
40.0000 mg | SUBCUTANEOUS | Status: DC
  Filled 2018-04-23 (×2): qty 0.4

## 2018-04-23 MED ORDER — ADULT MULTIVITAMIN W/MINERALS CH
1.0000 | ORAL_TABLET | Freq: Every day | ORAL | Status: DC
  Administered 2018-04-23 – 2018-04-30 (×8): 1 via ORAL
  Filled 2018-04-23 (×8): qty 1

## 2018-04-23 MED ORDER — POTASSIUM CHLORIDE CRYS ER 20 MEQ PO TBCR
20.0000 meq | EXTENDED_RELEASE_TABLET | Freq: Once | ORAL | Status: AC
  Administered 2018-04-23: 20 meq via ORAL
  Filled 2018-04-23: qty 1

## 2018-04-23 MED ORDER — PIPERACILLIN-TAZOBACTAM 3.375 G IVPB 30 MIN
3.3750 g | Freq: Once | INTRAVENOUS | Status: AC
  Administered 2018-04-23: 3.375 g via INTRAVENOUS
  Filled 2018-04-23: qty 50

## 2018-04-23 MED ORDER — SODIUM CHLORIDE 0.45 % IV SOLN
INTRAVENOUS | Status: DC
  Administered 2018-04-23 – 2018-04-30 (×9): via INTRAVENOUS

## 2018-04-23 MED ORDER — ONDANSETRON HCL 4 MG/2ML IJ SOLN
4.0000 mg | Freq: Four times a day (QID) | INTRAMUSCULAR | Status: DC | PRN
  Administered 2018-04-24 – 2018-04-28 (×7): 4 mg via INTRAVENOUS
  Filled 2018-04-23 (×7): qty 2

## 2018-04-23 MED ORDER — VANCOMYCIN HCL IN DEXTROSE 1-5 GM/200ML-% IV SOLN
1000.0000 mg | Freq: Three times a day (TID) | INTRAVENOUS | Status: DC
  Administered 2018-04-23 – 2018-04-25 (×7): 1000 mg via INTRAVENOUS
  Filled 2018-04-23 (×7): qty 200

## 2018-04-23 MED ORDER — KETOROLAC TROMETHAMINE 15 MG/ML IJ SOLN
30.0000 mg | Freq: Four times a day (QID) | INTRAMUSCULAR | Status: AC
  Administered 2018-04-23 (×2): 30 mg via INTRAVENOUS
  Filled 2018-04-23 (×2): qty 2

## 2018-04-23 MED ORDER — NALOXONE HCL 0.4 MG/ML IJ SOLN: 0.4000 mg | INTRAMUSCULAR | Status: DC | PRN

## 2018-04-23 NOTE — Progress Notes (Signed)
Patient is a 32 year old gentleman who is from [place] with known history of sickle cell disease who is here with family visiting his aunt for the Thanksgiving but developed sickle cell painful crisis.  [facility]  He is still having pain at 8 out of 10 currently on Dilaudid PCA with Toradol and IV fluids.  A review of patient's care everywhere shows he has been admitted multiple times a [facility] [place] [facility].  Patient has chronic pain syndrome and is opiate tolerant.  We will continue with Dilaudid PCA Toradol and his long-acting.  Patient has also been empirically on Zosyn and vancomycin.  He had recent acute chest syndrome in [place] but does not appear to be having similar symptoms here.  We will check cultures in the morning and if there is no positive cultures and if afebrile will discontinue antibiotics.  Goal is to treat patient enough to where he could be functional enough to move on back home.

## 2018-04-23 NOTE — Progress Notes (Signed)
Wasted 2ml Hydromorphone (Dilaudid) 1mg /mL from PCA syringe when replacing.  Witnessed by Celedonio MiyamotoHannah, Charge Nurse.

## 2018-04-23 NOTE — Progress Notes (Signed)
Pharmacy Antibiotic Note  Eric BorerJessie Rivera is a 32 y.o. male admitted on 04/22/2018 with acute chest syndrome of sickle cell disease.  Pharmacy has been consulted for Vancocin and Zosyn dosing.  Plan: Vancomycin 2000mg  x1 then 1000mg  IV every 8 hours.  Goal trough 15-20 mcg/mL. Zosyn 3.375g IV every 8 hours (4-hour infusion).  Height: 6\' 2"  (188 cm) Weight: 229 lb 6.4 oz (104.1 kg) IBW/kg (Calculated) : 82.2  Temp (24hrs), Avg:99.5 F (37.5 C), Min:98.6 F (37 C), Max:100.3 F (37.9 C)  Recent Labs  Lab 04/22/18 2220  WBC 21.1*  CREATININE 1.19    Estimated Creatinine Clearance: 113.6 mL/min (by C-G formula based on SCr of 1.19 mg/dL).    No Known Allergies   Thank you for allowing pharmacy to be a part of this patient's care.  Vernard GamblesVeronda Zia Kanner, PharmD, BCPS  04/23/2018 3:24 AM

## 2018-04-23 NOTE — Consult Note (Signed)
WOC Nurse wound consult note [facility] [phone], father at bedside.  Patient very drowsy, but able to provide information about his lower extremities. Reason for Consult:BLE Wound type: NO wounds present per patient report.  He states he did have wounds on the BLE, used Silvadene for them some time ago, but all areas have healed.  He states he uses a moisturizing lotion on his legs and wraps them in Ace wraps to maintain the healed status. Plan of care:   Allow the patient to perform care for the bilateral lower legs that consists of:  Gentle washing with soap and water, pat dry, apply liberal amounts of Coloplast Sween Cream (pink and white tube in clean utility), then wrap in 4 inch Ace wraps. Thank you for the consult.  Discussed plan of care with the patient and bedside nurse.  WOC nurse will not follow at this time.  Please re-consult the WOC team if needed.  Helmut MusterSherry Dorwin Fitzhenry, RN, MSN, CWOCN, CNS-BC, pager [phone]

## 2018-04-23 NOTE — Care Management Note (Signed)
Case Management Note 6E Cross Coverage  Patient Details  Name: Eric Rivera MRN: [id number] Date of Birth: [birth year]  Subjective/Objective:   [facility], [facility] care                 Action/Plan: PTA pt from home, independent, CM to continue to follow for transition of care needs  Expected Discharge Date:                  Expected Discharge Plan:  Acute to Acute Transfer  In-House Referral:     Discharge planning Services  CM Consult  Post Acute Care Choice:    Choice offered to:     DME Arranged:    DME Agency:     HH Arranged:    HH Agency:     Status of Service:  In process, will continue to follow  If discussed at Long Length of Stay Meetings, dates discussed:    Additional Comments:  Darrold SpanWebster, Denetra Formoso Hall, RN 04/23/2018, 11:05 AM [phone]

## 2018-04-23 NOTE — Progress Notes (Signed)
I called report to Elmarie Shileyiffany, RN to Eric Rivera. She had no further questions. Patient and his dad at bedside an updated. I gave report to [facility]. Patient transferred in same condition as my am assessment. He is in 7/10 pain which is improved per patient from early this am

## 2018-04-23 NOTE — Progress Notes (Signed)
[facility]. He made me aware that it would be quite a delay to get his blood as he is still waiting to hear back fromt he blood bank in [place] due to all of his antibodies

## 2018-04-23 NOTE — H&P (Addendum)
History and Physical    Eric Rivera ZOX:[id number] DOB: [birth year] DOA: 04/22/2018  PCP: Patient, No Pcp Per  Patient coming from: Home.  Chief Complaint: Chest pain and shortness of breath.  HPI: Eric Rivera is a 32 y.o. male with history of sickle cell anemia who is traveling from [place] to [place] for visiting family started developing chest pain and shortness of breath over the last few hours.  Has had mild nonproductive cough with some subjective feeling of fever and chills.  Denies any headache visual symptoms nausea vomiting abdominal pain or diarrhea.  Was admitted last month for sickle cell pain crisis at that time patient received 4 units of PRBC as per the care everywhere notes.  ED Course: In the ER patient was tachycardic mildly febrile and was complaining of pleuritic type of chest pain.  EKG was showing sinus tachycardia troponin was negative.  CT angiogram of the chest was negative for PE it showed some mediastinal lymphadenopathy and cardiomegaly.  Hemoglobin is around 4.9.  In care everywhere patient's baseline hemoglobin is around 6.  2 units of PRBC has been ordered.  Admitted for sickle cell pain crisis with possible acute chest syndrome for which patient has been started on empiric antibiotics.  Labs also showed leukocytosis.  Review of Systems: As per HPI, rest all negative.   Past Medical History:  Diagnosis Date  . Sickle cell crisis [facility])     Past Surgical History:  Procedure Laterality Date  . KNEE SURGERY       reports that he has never smoked. He has never used smokeless tobacco. He reports that he drank alcohol. His drug history is not on file.  No Known Allergies  Family History  Problem Relation Age of Onset  . Sickle cell anemia Brother     Prior to Admission medications   Medication Sig Start Date End Date Taking? Authorizing Provider  Cholecalciferol (VITAMIN D) 50 MCG (2000 UT) tablet Take 2,000 Units by mouth daily. 02/07/18  Yes  [provider]  folic acid (FOLVITE) 1 MG tablet Take 1 mg by mouth daily. 10/23/15  Yes [provider]  furosemide (LASIX) 40 MG tablet Take 40 mg by mouth daily. 03/31/18  Yes [provider]  ibuprofen (ADVIL,MOTRIN) 200 MG tablet Take 400 mg by mouth every 6 (six) hours as needed for moderate pain.   Yes [provider]  methadone (DOLOPHINE) 10 MG tablet Take 10 mg by mouth every 8 (eight) hours as needed. 04/12/18  Yes [provider]  Multiple Vitamin (MULTIVITAMIN WITH MINERALS) TABS tablet Take 1 tablet by mouth daily.   Yes [provider]  oxyCODONE (ROXICODONE) 15 MG immediate release tablet Take 15 mg by mouth every 4 (four) hours as needed. 04/12/18  Yes [provider]    Physical Exam: Vitals:   04/22/18 2245 04/22/18 2300 04/23/18 0008 04/23/18 0015  BP: 139/67 140/81 (!) 158/81 (!) 143/98  Pulse: (!) 102 (!) 104 (!) 117 88  Resp: (!) 25 20 (!) 26 19  Temp:      TempSrc:      SpO2: 92% 95% 92% 94%  Weight:      Height:          Constitutional: Moderately built and nourished. Vitals:   04/22/18 2245 04/22/18 2300 04/23/18 0008 04/23/18 0015  BP: 139/67 140/81 (!) 158/81 (!) 143/98  Pulse: (!) 102 (!) 104 (!) 117 88  Resp: (!) 25 20 (!) 26 19  Temp:  TempSrc:      SpO2: 92% 95% 92% 94%  Weight:      Height:       Eyes: Anicteric no pallor. ENMT: No discharge from the ears eyes nose or mouth. Neck: No mass felt.  No neck rigidity. Respiratory: No rhonchi or crepitations. Cardiovascular: S1-S2 heard no murmurs appreciated. Abdomen: Soft nontender bowel sounds present. Musculoskeletal: Patient wears lower extremity dressing. Skin: Patient with lower extremity dressing. Neurologic: Alert awake oriented to time place and person.  Moves all extremities. Psychiatric: Appears normal.  Normal affect.   Labs on Admission: I have personally reviewed following labs and imaging studies  CBC: Recent  Labs  Lab 04/22/18 2220  WBC 21.1*  NEUTROABS 12.5*  HGB 4.9*  HCT 15.2*  MCV 95.0  PLT 460*   Basic Metabolic Panel: Recent Labs  Lab 04/22/18 2220  NA 140  K 3.2*  CL 114*  CO2 19*  GLUCOSE 103*  BUN 11  CREATININE 1.19  CALCIUM 6.9*   GFR: Estimated Creatinine Clearance: 112.6 mL/min (by C-G formula based on SCr of 1.19 mg/dL). Liver Function Tests: Recent Labs  Lab 04/22/18 2220  AST 41  ALT 20  ALKPHOS 51  BILITOT 1.8*  PROT 4.9*  ALBUMIN 1.8*   No results for input(s): LIPASE, AMYLASE in the last 168 hours. No results for input(s): AMMONIA in the last 168 hours. Coagulation Profile: No results for input(s): INR, PROTIME in the last 168 hours. Cardiac Enzymes: No results for input(s): CKTOTAL, CKMB, CKMBINDEX, TROPONINI in the last 168 hours. BNP (last 3 results) No results for input(s): PROBNP in the last 8760 hours. HbA1C: No results for input(s): HGBA1C in the last 72 hours. CBG: No results for input(s): GLUCAP in the last 168 hours. Lipid Profile: No results for input(s): CHOL, HDL, LDLCALC, TRIG, CHOLHDL, LDLDIRECT in the last 72 hours. Thyroid Function Tests: No results for input(s): TSH, T4TOTAL, FREET4, T3FREE, THYROIDAB in the last 72 hours. Anemia Panel: Recent Labs    04/22/18 2220  RETICCTPCT 20.8*   Urine analysis:    Component Value Date/Time   COLORURINE YELLOW 11/05/2009 1011   APPEARANCEUR CLEAR 11/05/2009 1011   LABSPEC 1.009 11/05/2009 1011   PHURINE 6.5 11/05/2009 1011   GLUCOSEU NEGATIVE 11/05/2009 1011   HGBUR SMALL (A) 11/05/2009 1011   BILIRUBINUR NEGATIVE 11/05/2009 1011   KETONESUR NEGATIVE 11/05/2009 1011   PROTEINUR NEGATIVE 11/05/2009 1011   UROBILINOGEN 1.0 11/05/2009 1011   NITRITE NEGATIVE 11/05/2009 1011   LEUKOCYTESUR NEGATIVE 11/05/2009 1011   Sepsis Labs: @LABRCNTIP (procalcitonin:4,lacticidven:4) )No results found for this or any previous visit (from the past 240 hour(s)).   Radiological Exams on  Admission: Ct Angio Chest Pe W And/or Wo Contrast  Result Date: 04/23/2018 CLINICAL DATA:  Chest pain and shortness of breath. High pretest probability for pulmonary embolism. Sickle cell anemia. EXAM: CT ANGIOGRAPHY CHEST WITH CONTRAST TECHNIQUE: Multidetector CT imaging of the chest was performed using the standard protocol during bolus administration of intravenous contrast. Multiplanar CT image reconstructions and MIPs were obtained to evaluate the vascular anatomy. CONTRAST:  100mL ISOVUE-370 IOPAMIDOL (ISOVUE-370) INJECTION 76% COMPARISON:  None FINDINGS: Cardiovascular: Somewhat suboptimal opacification of pulmonary arteries noted, however no pulmonary emboli identified. No evidence of thoracic aortic dissection or aneurysm. Mild-to-moderate cardiomegaly. Mediastinum/Nodes: Mild mediastinal lymphadenopathy is seen in the right paratracheal region and AP window. Largest lymph node measures 1.6 cm in the AP window on image 49/5. No hilar lymphadenopathy identified. Lungs/Pleura: No pulmonary mass, infiltrate, or effusion. Upper abdomen:  No acute findings. Musculoskeletal: No suspicious bone lesions identified. Review of the MIP images confirms the above findings. IMPRESSION: No evidence of pulmonary embolism or active lung disease. Mild mediastinal lymphadenopathy, which is nonspecific. Recommend continued follow-up by chest CT in 3-6 months. This recommendation follows ACR consensus guidelines: Managing Incidental Findings on Thoracic CT: Mediastinal and Cardiovascular Findings. A White Paper of the ACR Incidental Findings Committee. J Am Coll Radiol. 2018; 15: 1610-9604. Mild-to-moderate cardiomegaly. Electronically Signed   By: Myles Rosenthal M.D.   On: 04/23/2018 00:29   Dg Chest Port 1 View  Result Date: 04/22/2018 CLINICAL DATA:  Chest pain and shortness of breath. EXAM: PORTABLE CHEST 1 VIEW COMPARISON:  Frontal and lateral views 05/23/2016 FINDINGS: Left chest port with tip in the mid SVC.  Cardiomegaly with question progression from prior exam versus differences in technique. Perihilar atelectasis and mild bronchial thickening. No confluent airspace disease. No pleural effusion or pneumothorax. No acute osseous abnormalities. IMPRESSION: 1. Cardiomegaly with question of progression from prior exam versus differences in technique. 2. Perihilar atelectasis and bronchial thickening. Electronically Signed   By: Narda Rutherford M.D.   On: 04/22/2018 22:26    EKG: Independently reviewed.  Sinus tachycardia.  Assessment/Plan Principal Problem:   Sickle cell pain crisis [facility]) Active Problems:   Chest pain   Sickle cell anemia (HCC)    1. Sickle cell pain crisis with possible acute chest syndrome -patient has been placed on weight-based Dilaudid PCA and 2 units of PRBC transfusion has been ordered.  Empiric antibiotics has been started.  Scheduled dose of Toradol.  Monitor and stepdown.  Discussed with pulmonology. 2. Sickle cell anemia for which 2 units of PRBC has been ordered. 3. 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  Will get wound team consult.  Lasix on hold for now.  Closely monitor respiratory status. 4. Mild hypokalemia replace and recheck.  Agree from Lasix use.  Lasix on hold for sickle cell pain crisis.   DVT prophylaxis: Lovenox. Code Status: Full code. Family Communication: Patient's mother. Disposition Plan: Home. Consults called: None.  Discussed with pulmonology. Admission status: Observation.   Eduard Clos MD [facility] Pager [phone].  If 7PM-7AM, please contact night-coverage [email or web address] Password [email or web address]  04/23/2018, 1:39 AM

## 2018-04-23 NOTE — ED Notes (Signed)
Family at bedside. 

## 2018-04-24 DIAGNOSIS — D5701 Hb-SS disease with acute chest syndrome: Secondary | ICD-10-CM

## 2018-04-24 LAB — EXPECTORATED SPUTUM ASSESSMENT W GRAM STAIN, RFLX TO RESP C

## 2018-04-24 LAB — CBC WITH DIFFERENTIAL/PLATELET
Abs Immature Granulocytes: 0.05 10*3/uL (ref 0.00–0.07)
Basophils Absolute: 0.1 10*3/uL (ref 0.0–0.1)
Basophils Relative: 1 %
Eosinophils Absolute: 1.6 10*3/uL — ABNORMAL HIGH (ref 0.0–0.5)
Eosinophils Relative: 12 %
HCT: 19.9 % — ABNORMAL LOW (ref 39.0–52.0)
Hemoglobin: 6.4 g/dL — CL (ref 13.0–17.0)
Immature Granulocytes: 0 %
Lymphocytes Relative: 13 %
Lymphs Abs: 1.7 10*3/uL (ref 0.7–4.0)
MCH: 29.9 pg (ref 26.0–34.0)
MCHC: 32.2 g/dL (ref 30.0–36.0)
MCV: 93 fL (ref 80.0–100.0)
MONO ABS: 1.2 10*3/uL — AB (ref 0.1–1.0)
Monocytes Relative: 9 %
Neutro Abs: 8.2 10*3/uL — ABNORMAL HIGH (ref 1.7–7.7)
Neutrophils Relative %: 65 %
Platelets: 489 10*3/uL — ABNORMAL HIGH (ref 150–400)
RBC: 2.14 MIL/uL — ABNORMAL LOW (ref 4.22–5.81)
RDW: 20.5 % — ABNORMAL HIGH (ref 11.5–15.5)
WBC: 12.7 10*3/uL — ABNORMAL HIGH (ref 4.0–10.5)
nRBC: 2 % — ABNORMAL HIGH (ref 0.0–0.2)

## 2018-04-24 MED ORDER — GUAIFENESIN-DM 100-10 MG/5ML PO SYRP
10.0000 mL | ORAL_SOLUTION | ORAL | Status: DC | PRN
  Administered 2018-04-26: 10 mL via ORAL
  Filled 2018-04-24: qty 10

## 2018-04-24 MED ORDER — ACETAMINOPHEN 500 MG PO TABS
1000.0000 mg | ORAL_TABLET | Freq: Once | ORAL | Status: AC
  Administered 2018-04-24: 1000 mg via ORAL
  Filled 2018-04-24: qty 2

## 2018-04-24 NOTE — Progress Notes (Signed)
Patient blood transfusion was started after Blood bank notified [facility] nurse.  First Fifteen minutes at start of transfusion patient's temp was 100.5.  Dr. Rana SnareBodenheimer was notified and orders received for tylenol.

## 2018-04-24 NOTE — Progress Notes (Signed)
CRITICAL VALUE ALERT  Critical Value:  HGB 6.4  Date & Time Notied:  04/24/18  @ 1407  Provider Notified: Hyman HopesJegede [phone]  Orders Received/Actions taken: waiting for orders

## 2018-04-24 NOTE — Progress Notes (Signed)
Patient ID: Eric Rivera, male   DOB: 10/03/1985, 32 y.o.   MRN: [id number] Subjective: Patient is a 32 year old gentleman who is from [place] with known history of sickle cell disease who is here with family visiting his aunt for the Thanksgiving but developed sickle cell painful crisis, admitted yesterday with low Hb now S/P transfusion of one unit of PRBC. From Care Everywhere, baseline Hb appears to be around 6.   Patient is still in pain, rates his pain at 7-8/10, mostly in his chest. Still coughing out brownish sputum, coughing is associated with pain. Patient currently on antibiotics, had fever on admission but no more. Denies SOB, no nausea or vomiting, no diarrhea.   Objective:  Vital signs in last 24 hours:  Vitals:   04/24/18 0545 04/24/18 0800 04/24/18 1111 04/24/18 1115  BP: 130/70  126/75   Pulse: 86  75   Resp: 15 10 10 14   Temp: 100.3 F (37.9 C)  98.8 F (37.1 C)   TempSrc: Oral  Oral   SpO2: 94% 96% 97% 93%  Weight:      Height:       Intake/Output from previous day:   Intake/Output Summary (Last 24 hours) at 04/24/2018 1159 Last data filed at 04/24/2018 1115 Gross per 24 hour  Intake 2148 ml  Output 1850 ml  Net 298 ml   Physical Exam: General: Alert, awake, oriented x3, in no acute distress.  HEENT: [place]/AT PEERL, EOMI Neck: Trachea midline,  no masses, no thyromegal,y no JVD, no carotid bruit OROPHARYNX:  Moist, No exudate/ erythema/lesions.  Heart: Regular rate and rhythm, without murmurs, rubs, gallops, PMI non-displaced, no heaves or thrills on palpation.  Lungs: Clear to auscultation, no wheezing or rhonchi noted. No increased vocal fremitus resonant to percussion  Abdomen: Soft, nontender, nondistended, positive bowel sounds, no masses no hepatosplenomegaly noted..  Neuro: No focal neurological deficits noted cranial nerves II through XII grossly intact. DTRs 2+ bilaterally upper and lower extremities. Strength 5 out of 5 in bilateral upper and  lower extremities. Musculoskeletal: No warm swelling or erythema around joints, no spinal tenderness noted. Psychiatric: Patient alert and oriented x3, good insight and cognition, good recent to remote recall. Lymph node survey: No cervical axillary or inguinal lymphadenopathy noted.  Lab Results:  Basic Metabolic Panel:    Component Value Date/Time   NA 140 04/22/2018 2220   K 3.2 (L) 04/22/2018 2220   CL 114 (H) 04/22/2018 2220   CO2 19 (L) 04/22/2018 2220   BUN 11 04/22/2018 2220   CREATININE 1.19 04/23/2018 0402   GLUCOSE 103 (H) 04/22/2018 2220   CALCIUM 6.9 (L) 04/22/2018 2220   CBC:    Component Value Date/Time   WBC 21.9 (H) 04/23/2018 0402   HGB 5.4 (LL) 04/23/2018 0402   HCT 17.6 (L) 04/23/2018 0402   PLT 522 (H) 04/23/2018 0402   MCV 95.7 04/23/2018 0402   NEUTROABS 12.5 (H) 04/22/2018 2220   LYMPHSABS 3.7 04/22/2018 2220   MONOABS 1.2 (H) 04/22/2018 2220   EOSABS 3.1 (H) 04/22/2018 2220   BASOSABS 0.1 04/22/2018 2220    No results found for this or any previous visit (from the past 240 hour(s)).  Studies/Results: Ct Angio Chest Pe W And/or Wo Contrast  Result Date: 04/23/2018 CLINICAL DATA:  Chest pain and shortness of breath. High pretest probability for pulmonary embolism. Sickle cell anemia. EXAM: CT ANGIOGRAPHY CHEST WITH CONTRAST TECHNIQUE: Multidetector CT imaging of the chest was performed using the standard protocol during bolus administration of  intravenous contrast. Multiplanar CT image reconstructions and MIPs were obtained to evaluate the vascular anatomy. CONTRAST:  ISOVUE-370 IOPAMIDOL (ISOVUE-370) INJECTION 76% COMPARISON:  None FINDINGS: Cardiovascular: Somewhat suboptimal opacification of pulmonary arteries noted, however no pulmonary emboli identified. No evidence of thoracic aortic dissection or aneurysm. Mild-to-moderate cardiomegaly. Mediastinum/Nodes: Mild mediastinal lymphadenopathy is seen in the right paratracheal region and AP  window. Largest lymph node measures 1.6 cm in the AP window on image 49/5. No hilar lymphadenopathy identified. Lungs/Pleura: No pulmonary mass, infiltrate, or effusion. Upper abdomen: No acute findings. Musculoskeletal: No suspicious bone lesions identified. Review of the MIP images confirms the above findings. IMPRESSION: No evidence of pulmonary embolism or active lung disease. Mild mediastinal lymphadenopathy, which is nonspecific. Recommend continued follow-up by chest CT in 3-6 months. This recommendation follows ACR consensus guidelines: Managing Incidental Findings on Thoracic CT: Mediastinal and Cardiovascular Findings. A White Paper of the ACR Incidental Findings Committee. J Am Coll Radiol. 2018; 15: 9604-5409. Mild-to-moderate cardiomegaly. Electronically Signed   By: Myles Rosenthal M.D.   On: 04/23/2018 00:29   Dg Chest Port 1 View  Result Date: 04/22/2018 CLINICAL DATA:  Chest pain and shortness of breath. EXAM: PORTABLE CHEST 1 VIEW COMPARISON:  Frontal and lateral views 05/23/2016 FINDINGS: Left chest port with tip in the mid SVC. Cardiomegaly with question progression from prior exam versus differences in technique. Perihilar atelectasis and mild bronchial thickening. No confluent airspace disease. No pleural effusion or pneumothorax. No acute osseous abnormalities. IMPRESSION: 1. Cardiomegaly with question of progression from prior exam versus differences in technique. 2. Perihilar atelectasis and bronchial thickening. Electronically Signed   By: Narda Rutherford M.D.   On: 04/22/2018 22:26    Medications: Scheduled Meds: . sodium chloride   Intravenous Once  . cholecalciferol  2,000 Units Oral Daily  . enoxaparin (LOVENOX) injection  40 mg Subcutaneous Q24H  . folic acid  1 mg Oral Daily  . HYDROmorphone   Intravenous Q4H  . methadone  10 mg Oral Daily  . multivitamin with minerals  1 tablet Oral Daily  . senna-docusate  1 tablet Oral BID   Continuous Infusions: . sodium chloride  25 mL/hr at 04/23/18 1503  . diphenhydrAMINE    . piperacillin-tazobactam (ZOSYN)  IV 3.375 g (04/24/18 1119)  . vancomycin Stopped (04/24/18 0957)   PRN Meds:.diphenhydrAMINE, diphenhydrAMINE, guaiFENesin-dextromethorphan, naloxone **AND** sodium chloride flush, ondansetron (ZOFRAN) IV, polyethylene glycol, sodium chloride flush, sodium chloride flush  Assessment/Plan: Principal Problem:   Sickle cell pain crisis (HCC) Active Problems:   Chest pain   Sickle cell anemia (HCC)  1. Hb Sickle Cell Disease with crisis: Continue IVF D5 .45% Saline @ 125 mls/hour, continue weight based Dilaudid PCA, IV Toradol 30 mg Q 6 H, Monitor vitals very closely, Re-evaluate pain scale regularly, 2 L of Oxygen by Carmi. 2. Leukocytosis: Patient on antibiotics for possible ACS Vs URI. Will monitor blood/sputum/urine cultures. 3. Cough: CT Chest showed no evidence of PE or Active Lung Disease. If cultures are negative, will D/C antibiotics. Add Robitussin for expectoration.  4. Sickle Cell Anemia: S/P blood transfusion, Hb is at 6.4, stable. Will continue to monitor. 5. Chronic pain Syndrome: Restart home medications 6. Chronic Wounds - bilateral Lower Limb: No evidence of infection, continue ted os and dressing, hold lasix for now because of crisis and possible dehydration. 7. Hypokalemia: Resolved  Code Status: Full Code Family Communication: N/A Disposition Plan: Not yet ready for discharge  Nickisha Hum  If 7PM-7AM, please contact night-coverage.  04/24/2018, 11:59  AM  LOS: 1 day

## 2018-04-24 NOTE — Plan of Care (Signed)
  Problem: Elimination: Goal: Will not experience complications related to bowel motility Outcome: Progressing   Problem: Safety: Goal: Ability to remain free from injury will improve Outcome: Progressing   Problem: Nutrition: Goal: Adequate nutrition will be maintained Outcome: Progressing   

## 2018-04-25 LAB — CBC WITH DIFFERENTIAL/PLATELET
Abs Immature Granulocytes: 0.05 10*3/uL (ref 0.00–0.07)
Basophils Absolute: 0 10*3/uL (ref 0.0–0.1)
Basophils Relative: 0 %
EOS PCT: 10 %
Eosinophils Absolute: 1.1 10*3/uL — ABNORMAL HIGH (ref 0.0–0.5)
HCT: 17.8 % — ABNORMAL LOW (ref 39.0–52.0)
Hemoglobin: 5.7 g/dL — CL (ref 13.0–17.0)
Immature Granulocytes: 0 %
Lymphocytes Relative: 21 %
Lymphs Abs: 2.4 10*3/uL (ref 0.7–4.0)
MCH: 29.7 pg (ref 26.0–34.0)
MCHC: 32 g/dL (ref 30.0–36.0)
MCV: 92.7 fL (ref 80.0–100.0)
Monocytes Absolute: 1.3 10*3/uL — ABNORMAL HIGH (ref 0.1–1.0)
Monocytes Relative: 12 %
Neutro Abs: 6.5 10*3/uL (ref 1.7–7.7)
Neutrophils Relative %: 57 %
Platelets: 434 10*3/uL — ABNORMAL HIGH (ref 150–400)
RBC: 1.92 MIL/uL — ABNORMAL LOW (ref 4.22–5.81)
RDW: 19.9 % — ABNORMAL HIGH (ref 11.5–15.5)
WBC: 11.4 10*3/uL — ABNORMAL HIGH (ref 4.0–10.5)
nRBC: 1.3 % — ABNORMAL HIGH (ref 0.0–0.2)

## 2018-04-25 MED ORDER — SODIUM CHLORIDE 0.9% IV SOLUTION: Freq: Once | INTRAVENOUS | Status: DC

## 2018-04-25 NOTE — Progress Notes (Signed)
PHARMACY NOTE -  Zosyn  Pharmacy has been assisting with dosing of Zosyn for PNA,acute chest syndrome. Dosage remains stable at Zosyn 3.375gm IV Q8h to be infused over 4hrs and need for further dosage adjustment appears unlikely at present.    Will sign off at this time & monitor peripherally via electronic surveillance software..    *Please de-escalate to cephalosporin +/- Zithromax if continued antibiotics needed.  Please reconsult if a change in clinical status warrants re-evaluation of dosage.  Junita PushMichelle Adrianah Prophete, PharmD, BCPS 04/25/2018@12 :10 PM

## 2018-04-25 NOTE — Progress Notes (Signed)
Patient ID: Eric Rivera, male   DOB: 22-Feb-1986, 32 y.o.   MRN: [id number] Subjective: Patient is a 32 year old gentleman who is from [place] with known history of sickle cell disease who is here with family visiting his aunt for the Thanksgiving but developed sickle cell painful crisis, admitted yesterday with low Hb now S/P transfusion of one unit of PRBC. From Care Everywhere, baseline Hb appears to be around 6 but patient said his Hb baseline is around 7   Today, patient is still in pain, rates his pain at 6-7/10, mostly in his chest and lower limbs. Hb is down to 5.7 today, no fever, chest pain is much better, no nausea, no vomiting, no diarrhea.  All cultures are negative so far.  Objective:  Vital signs in last 24 hours:  Vitals:   04/25/18 0025 04/25/18 0408 04/25/18 0553 04/25/18 1119  BP: 135/67 117/68  (!) 128/56  Pulse: 75 70  76  Resp: 18 16 18 18   Temp: 99 F (37.2 C) 98.6 F (37 C)  99 F (37.2 C)  TempSrc: Oral Oral  Oral  SpO2: 94% 100% 99% 98%  Weight:      Height:       Intake/Output from previous day:  No intake or output data in the 24 hours ending 04/25/18 1227 Physical Exam: General: Alert, awake, oriented x3, in no acute distress.  HEENT: [place]/AT PEERL, EOMI Neck: Trachea midline,  no masses, no thyromegal,y no JVD, no carotid bruit OROPHARYNX:  Moist, No exudate/ erythema/lesions.  Heart: Regular rate and rhythm, without murmurs, rubs, gallops, PMI non-displaced, no heaves or thrills on palpation.  Lungs: Clear to auscultation, no wheezing or rhonchi noted. No increased vocal fremitus resonant to percussion  Abdomen: Soft, nontender, nondistended, positive bowel sounds, no masses no hepatosplenomegaly noted..  Neuro: No focal neurological deficits noted cranial nerves II through XII grossly intact. DTRs 2+ bilaterally upper and lower extremities. Strength 5 out of 5 in bilateral upper and lower extremities. Musculoskeletal: No warm swelling or erythema  around joints, no spinal tenderness noted. Psychiatric: Patient alert and oriented x3, good insight and cognition, good recent to remote recall. Lymph node survey: No cervical axillary or inguinal lymphadenopathy noted.  Lab Results:  Basic Metabolic Panel:    Component Value Date/Time   NA 140 04/22/2018 2220   K 3.2 (L) 04/22/2018 2220   CL 114 (H) 04/22/2018 2220   CO2 19 (L) 04/22/2018 2220   BUN 11 04/22/2018 2220   CREATININE 1.19 04/23/2018 0402   GLUCOSE 103 (H) 04/22/2018 2220   CALCIUM 6.9 (L) 04/22/2018 2220   CBC:    Component Value Date/Time   WBC 11.4 (H) 04/25/2018 0349   HGB 5.7 (LL) 04/25/2018 0349   HCT 17.8 (L) 04/25/2018 0349   PLT 434 (H) 04/25/2018 0349   MCV 92.7 04/25/2018 0349   NEUTROABS 6.5 04/25/2018 0349   LYMPHSABS 2.4 04/25/2018 0349   MONOABS 1.3 (H) 04/25/2018 0349   EOSABS 1.1 (H) 04/25/2018 0349   BASOSABS 0.0 04/25/2018 0349    Recent Results (from the past 240 hour(s))  Culture, blood (routine x 2)     Status: None (Preliminary result)   Collection Time: 04/24/18 12:45 PM  Result Value Ref Range Status   Specimen Description BLOOD RIGHT ANTECUBITAL  Final   Special Requests   Final    BOTTLES DRAWN AEROBIC ONLY Blood Culture results may not be optimal due to an inadequate volume of blood received in culture bottles   Culture  Final    NO GROWTH < 24 HOURS Performed at [facility], [address] [place], [place] [address]    Report Status PENDING  Incomplete  Culture, blood (routine x 2)     Status: None (Preliminary result)   Collection Time: 04/24/18 12:46 PM  Result Value Ref Range Status   Specimen Description   Final    BLOOD LEFT HAND Performed at [facility], [address], [place], [place] [address]    Special Requests   Final    BOTTLES DRAWN AEROBIC ONLY Blood Culture results may not be optimal due to an inadequate volume of blood received in culture bottles   Culture   Final    NO  GROWTH < 24 HOURS Performed at [facility], [address] [place], [place] [address]    Report Status PENDING  Incomplete  Expectorated sputum assessment w rflx to resp cult     Status: None   Collection Time: 04/24/18  5:36 PM  Result Value Ref Range Status   Specimen Description EXPECTORATED SPUTUM  Final   Special Requests NONE  Final   Sputum evaluation   Final    THIS SPECIMEN IS ACCEPTABLE FOR SPUTUM CULTURE Performed at [facility], [address], [place], [place] [address]    Report Status 04/24/2018 FINAL  Final  Culture, respiratory     Status: None (Preliminary result)   Collection Time: 04/24/18  5:36 PM  Result Value Ref Range Status   Specimen Description   Final    EXPECTORATED SPUTUM Performed at [facility], [address], [place], [place] [address]    Special Requests   Final    NONE Reflexed from [id number] Performed at [facility], [address] [place], [place] [address]    Gram Stain NO WBC SEEN NO ORGANISMS SEEN   Final   Culture   Final    CULTURE REINCUBATED FOR BETTER GROWTH Performed at [facility], [address], [place], [place] [address]    Report Status PENDING  Incomplete    Studies/Results: No results found.  Medications: Scheduled Meds: . sodium chloride   Intravenous Once  . cholecalciferol  2,000 Units Oral Daily  . enoxaparin (LOVENOX) injection  40 mg Subcutaneous Q24H  . folic acid  1 mg Oral Daily  . HYDROmorphone   Intravenous Q4H  . methadone  10 mg Oral Daily  . multivitamin with minerals  1 tablet Oral Daily  . senna-docusate  1 tablet Oral BID   Continuous Infusions: . sodium chloride 25 mL/hr at 04/25/18 0601  . diphenhydrAMINE 25 mg (04/25/18 0032)  . piperacillin-tazobactam (ZOSYN)  IV 3.375 g (04/25/18 1212)   PRN Meds:.diphenhydrAMINE, diphenhydrAMINE, guaiFENesin-dextromethorphan, naloxone **AND** sodium chloride flush, ondansetron  (ZOFRAN) IV, polyethylene glycol, sodium chloride flush, sodium chloride flush  Assessment/Plan: Principal Problem:   Sickle cell pain crisis (HCC) Active Problems:   Chest pain   Sickle cell anemia (HCC)   Acute chest syndrome (HCC)  1. Hb Sickle Cell Disease with crisis: Reduce IVF D5 .45% Saline to KVO, continue weight based Dilaudid PCA, IV Toradol 30 mg Q 6 H, Monitor vitals very closely, Re-evaluate pain scale regularly, 2 L of Oxygen by [place]. 2. Leukocytosis: Patient on antibiotics for possible ACS Vs URI. Blood Cx is negative, will D/C Vancomycin, if blood and sputum Cxs are negative x 48 hours, will D/C Zosyn 3. Cough: CT Chest showed no evidence of  PE or Active Lung Disease. If cultures are negative, will D/C antibiotics. Continue Robitussin for expectoration.  4. Sickle Cell Anemia: S/P blood transfusion, Hb is at down to 5.7 today, will transfuse 2 units of PRBC today, continue to monitor. 5. Chronic pain Syndrome: Continue home medications 6. Chronic Wounds - bilateral Lower Limb: No evidence of infection, continue ted os and dressing, hold lasix for now because of crisis and possible dehydration. 7. Hypokalemia: Resolved  Code Status: Full Code Family Communication: N/A Disposition Plan: Not yet ready for discharge  Lorea Kupfer  If 7PM-7AM, please contact night-coverage.  04/25/2018, 12:27 PM  LOS: 2 days

## 2018-04-26 LAB — URINE CULTURE: Culture: NO GROWTH

## 2018-04-26 LAB — CBC WITH DIFFERENTIAL/PLATELET
Abs Immature Granulocytes: 0.04 10*3/uL (ref 0.00–0.07)
BASOS PCT: 0 %
Basophils Absolute: 0 10*3/uL (ref 0.0–0.1)
Eosinophils Absolute: 0.7 10*3/uL — ABNORMAL HIGH (ref 0.0–0.5)
Eosinophils Relative: 7 %
HCT: 19.6 % — ABNORMAL LOW (ref 39.0–52.0)
Hemoglobin: 6.5 g/dL — CL (ref 13.0–17.0)
Immature Granulocytes: 0 %
Lymphocytes Relative: 26 %
Lymphs Abs: 2.5 10*3/uL (ref 0.7–4.0)
MCH: 30 pg (ref 26.0–34.0)
MCHC: 33.2 g/dL (ref 30.0–36.0)
MCV: 90.3 fL (ref 80.0–100.0)
Monocytes Absolute: 1.4 10*3/uL — ABNORMAL HIGH (ref 0.1–1.0)
Monocytes Relative: 14 %
NEUTROS PCT: 53 %
Neutro Abs: 5.2 10*3/uL (ref 1.7–7.7)
Platelets: 344 10*3/uL (ref 150–400)
RBC: 2.17 MIL/uL — ABNORMAL LOW (ref 4.22–5.81)
RDW: 18.7 % — ABNORMAL HIGH (ref 11.5–15.5)
WBC: 9.8 10*3/uL (ref 4.0–10.5)
nRBC: 1.5 % — ABNORMAL HIGH (ref 0.0–0.2)

## 2018-04-26 LAB — COMPREHENSIVE METABOLIC PANEL
ALT: 93 U/L — AB (ref 0–44)
AST: 137 U/L — ABNORMAL HIGH (ref 15–41)
Albumin: 2.1 g/dL — ABNORMAL LOW (ref 3.5–5.0)
Alkaline Phosphatase: 49 U/L (ref 38–126)
Anion gap: 6 (ref 5–15)
BUN: 29 mg/dL — ABNORMAL HIGH (ref 6–20)
CO2: 24 mmol/L (ref 22–32)
CREATININE: 2.76 mg/dL — AB (ref 0.61–1.24)
Calcium: 7.2 mg/dL — ABNORMAL LOW (ref 8.9–10.3)
Chloride: 107 mmol/L (ref 98–111)
GFR calc Af Amer: 33 mL/min — ABNORMAL LOW (ref >60)
GFR calc non Af Amer: 29 mL/min — ABNORMAL LOW (ref >60)
Glucose, Bld: 112 mg/dL — ABNORMAL HIGH (ref 70–99)
Potassium: 4.3 mmol/L (ref 3.5–5.1)
Sodium: 137 mmol/L (ref 135–145)
Total Bilirubin: 1.5 mg/dL — ABNORMAL HIGH (ref 0.3–1.2)
Total Protein: 5.3 g/dL — ABNORMAL LOW (ref 6.5–8.1)

## 2018-04-26 LAB — PREPARE RBC (CROSSMATCH)

## 2018-04-26 MED ORDER — GUAIFENESIN-DM 100-10 MG/5ML PO SYRP
10.0000 mL | ORAL_SOLUTION | ORAL | Status: DC
  Administered 2018-04-26 – 2018-04-30 (×13): 10 mL via ORAL
  Filled 2018-04-26 (×13): qty 10

## 2018-04-26 NOTE — Progress Notes (Signed)
Patient ID: Eric Rivera, male   DOB: [name], 32 y.o.   MRN: [id number] Subjective: Patient is a 32 year old gentleman who is from [place] with known history of sickle cell disease who is here with family visiting his aunt for the Thanksgiving but developed sickle cell painful crisis, admitted yesterday with low Hb now S/P transfusion of one unit of PRBC. From Care Everywhere, baseline Hb appears to be around 6 but patient said his Hb baseline is around 7.  Patient was transfused with 2 units of PRBC yesterday making a total of 3 units of transfusion during this admission. Hb is up to 6.5 today. He claims his pain is much better today but not yet at baseline. All cultures are negative so far. He denies any fever, no chest pain, no SOB, ambulating well.  Objective:  Vital signs in last 24 hours:  Vitals:   04/26/18 0500 04/26/18 0723 04/26/18 0815 04/26/18 1210  BP: 128/73 116/71  134/81  Pulse: 68 65  79  Resp: 15 12 11 17   Temp: 99.3 F (37.4 C) 99 F (37.2 C)  98.9 F (37.2 C)  TempSrc: Oral Oral  Oral  SpO2: 94% 95% 96% 93%  Weight:      Height:        Intake/Output from previous day:   Intake/Output Summary (Last 24 hours) at 04/26/2018 1414 Last data filed at 04/26/2018 0500 Gross per 24 hour  Intake 679 ml  Output 1680 ml  Net -1001 ml    Physical Exam: General: Alert, awake, oriented x3, in no acute distress.  HEENT: [place]/AT PEERL, EOMI Neck: Trachea midline,  no masses, no thyromegal,y no JVD, no carotid bruit OROPHARYNX:  Moist, No exudate/ erythema/lesions.  Heart: Regular rate and rhythm, without murmurs, rubs, gallops, PMI non-displaced, no heaves or thrills on palpation.  Lungs: Clear to auscultation, no wheezing or rhonchi noted. No increased vocal fremitus resonant to percussion  Abdomen: Soft, nontender, nondistended, positive bowel sounds, no masses no hepatosplenomegaly noted..  Neuro: No focal neurological deficits noted cranial nerves II through XII  grossly intact. DTRs 2+ bilaterally upper and lower extremities. Strength 5 out of 5 in bilateral upper and lower extremities. Musculoskeletal: No warm swelling or erythema around joints, no spinal tenderness noted. Psychiatric: Patient alert and oriented x3, good insight and cognition, good recent to remote recall. Lymph node survey: No cervical axillary or inguinal lymphadenopathy noted.  Lab Results:  Basic Metabolic Panel:    Component Value Date/Time   NA 137 04/26/2018 0650   K 4.3 04/26/2018 0650   CL 107 04/26/2018 0650   CO2 24 04/26/2018 0650   BUN 29 (H) 04/26/2018 0650   CREATININE 2.76 (H) 04/26/2018 0650   GLUCOSE 112 (H) 04/26/2018 0650   CALCIUM 7.2 (L) 04/26/2018 0650   CBC:    Component Value Date/Time   WBC 9.8 04/26/2018 0650   HGB 6.5 (LL) 04/26/2018 0650   HCT 19.6 (L) 04/26/2018 0650   PLT 344 04/26/2018 0650   MCV 90.3 04/26/2018 0650   NEUTROABS 5.2 04/26/2018 0650   LYMPHSABS 2.5 04/26/2018 0650   MONOABS 1.4 (H) 04/26/2018 0650   EOSABS 0.7 (H) 04/26/2018 0650   BASOSABS 0.0 04/26/2018 0650    Recent Results (from the past 240 hour(s))  Culture, blood (routine x 2)     Status: None (Preliminary result)   Collection Time: 04/24/18 12:45 PM  Result Value Ref Range Status   Specimen Description BLOOD RIGHT ANTECUBITAL  Final   Special Requests  Final    BOTTLES DRAWN AEROBIC ONLY Blood Culture results may not be optimal due to an inadequate volume of blood received in culture bottles   Culture   Final    NO GROWTH 2 DAYS Performed at [facility], [address], [place], [place] [address]    Report Status PENDING  Incomplete  Culture, blood (routine x 2)     Status: None (Preliminary result)   Collection Time: 04/24/18 12:46 PM  Result Value Ref Range Status   Specimen Description   Final    BLOOD LEFT HAND Performed at [facility], [address], [place], [place] [address]    Special Requests   Final     BOTTLES DRAWN AEROBIC ONLY Blood Culture results may not be optimal due to an inadequate volume of blood received in culture bottles   Culture   Final    NO GROWTH 2 DAYS Performed at [facility], [address] [place], [place] [address]    Report Status PENDING  Incomplete  Culture, Urine     Status: None   Collection Time: 04/24/18  5:36 PM  Result Value Ref Range Status   Specimen Description   Final    URINE, CLEAN CATCH Performed at [facility], [address] [place], [place] [address]    Special Requests   Final    NONE Performed at [facility], [address], [place], [place] [address]    Culture   Final    NO GROWTH Performed at [facility], [address] [place], [place] [address]    Report Status 04/26/2018 FINAL  Final  Expectorated sputum assessment w rflx to resp cult     Status: None   Collection Time: 04/24/18  5:36 PM  Result Value Ref Range Status   Specimen Description EXPECTORATED SPUTUM  Final   Special Requests NONE  Final   Sputum evaluation   Final    THIS SPECIMEN IS ACCEPTABLE FOR SPUTUM CULTURE Performed at [facility], [address] [place], [place] [address]    Report Status 04/24/2018 FINAL  Final  Culture, respiratory     Status: None (Preliminary result)   Collection Time: 04/24/18  5:36 PM  Result Value Ref Range Status   Specimen Description   Final    EXPECTORATED SPUTUM Performed at [facility], [address] [place], [place] [address]    Special Requests   Final    NONE Reflexed from [id number] Performed at [facility], [address] [place], [place] [address]    Gram Stain NO WBC SEEN NO ORGANISMS SEEN   Final   Culture   Final    FEW Consistent with normal respiratory flora. Performed at [facility], [address], [place], [place] [address]    Report Status PENDING  Incomplete     Studies/Results: No results found.  Medications: Scheduled Meds: . sodium chloride   Intravenous Once  . cholecalciferol  2,000 Units Oral Daily  . enoxaparin (LOVENOX) injection  40 mg Subcutaneous Q24H  . folic acid  1 mg Oral Daily  . HYDROmorphone   Intravenous Q4H  . methadone  10 mg Oral Daily  . multivitamin with minerals  1 tablet Oral Daily  . senna-docusate  1 tablet Oral BID   Continuous Infusions: . sodium chloride 150 mL/hr at 04/26/18 0952  . diphenhydrAMINE 25 mg (04/25/18 0032)  PRN Meds:.diphenhydrAMINE, diphenhydrAMINE, guaiFENesin-dextromethorphan, naloxone **AND** sodium chloride flush, ondansetron (ZOFRAN) IV, polyethylene glycol, sodium chloride flush, sodium chloride flush  Assessment/Plan: Principal Problem:   Sickle cell pain crisis (HCC) Active Problems:   Chest pain   Sickle cell anemia (HCC)   Acute chest syndrome (HCC)  1. Hb Sickle Cell Disease with crisis: Restart IVF D5 .45% Saline @ 150 cc/Hr, continue weight based Dilaudid PCA, discontinue IV Toradol due to AKI, Monitor vitals very closely, Re-evaluate pain scale regularly, 2 L of Oxygen by [place]. 2. Acute Kidney Injury: Serum Creatinine is elevated to 2.76, possibly multifactorial, dehydration, Toradol, Vacomycin and more. IVF @ 150cc/hr, D/C Toradol, D/C all antibiotics   3. Leukocytosis: Patient on antibiotics for possible ACS Vs URI. Blood Cx and all cultures are negative, will D/C all antibiotics  4. Cough: CT Chest showed no evidence of PE or Active Lung Disease. Continue Robitussin for expectoration.  5. Sickle Cell Anemia: S/P blood transfusion, Hb is at 6.5 today, will continue to monitor. 6. Chronic pain Syndrome: Continue home medications 7. Chronic Wounds - bilateral Lower Limb: No evidence of infection, continue ted os and dressing, hold lasix for now because of crisis and possible dehydration. 8. Hypokalemia: Resolved  Code Status: Full Code Family Communication:  N/A Disposition Plan: Not yet ready for discharge  Yeira Gulden  If 7PM-7AM, please contact night-coverage.  04/26/2018, 2:14 PM  LOS: 3 days

## 2018-04-27 DIAGNOSIS — D5700 Hb-SS disease with crisis, unspecified: Secondary | ICD-10-CM

## 2018-04-27 DIAGNOSIS — D5701 Hb-SS disease with acute chest syndrome: Principal | ICD-10-CM

## 2018-04-27 DIAGNOSIS — R079 Chest pain, unspecified: Secondary | ICD-10-CM

## 2018-04-27 LAB — BPAM RBC
BLOOD PRODUCT EXPIRATION DATE: 201912232359
Blood Product Expiration Date: 201912212359
Blood Product Expiration Date: 201912262359
ISSUE DATE / TIME: 201911300145
ISSUE DATE / TIME: 201912011518
ISSUE DATE / TIME: 201912020147
UNIT TYPE AND RH: 5100
Unit Type and Rh: 5100
Unit Type and Rh: 5100

## 2018-04-27 LAB — CBC WITH DIFFERENTIAL/PLATELET
ABS IMMATURE GRANULOCYTES: 0.06 10*3/uL (ref 0.00–0.07)
Basophils Absolute: 0.1 10*3/uL (ref 0.0–0.1)
Basophils Relative: 0 %
Eosinophils Absolute: 1.1 10*3/uL — ABNORMAL HIGH (ref 0.0–0.5)
Eosinophils Relative: 9 %
HCT: 18.7 % — ABNORMAL LOW (ref 39.0–52.0)
Hemoglobin: 6.2 g/dL — CL (ref 13.0–17.0)
Immature Granulocytes: 1 %
Lymphocytes Relative: 18 %
Lymphs Abs: 2.3 10*3/uL (ref 0.7–4.0)
MCH: 29.8 pg (ref 26.0–34.0)
MCHC: 33.2 g/dL (ref 30.0–36.0)
MCV: 89.9 fL (ref 80.0–100.0)
Monocytes Absolute: 1.2 10*3/uL — ABNORMAL HIGH (ref 0.1–1.0)
Monocytes Relative: 10 %
Neutro Abs: 8 10*3/uL — ABNORMAL HIGH (ref 1.7–7.7)
Neutrophils Relative %: 62 %
Platelets: 343 10*3/uL (ref 150–400)
RBC: 2.08 MIL/uL — ABNORMAL LOW (ref 4.22–5.81)
RDW: 19.3 % — ABNORMAL HIGH (ref 11.5–15.5)
WBC: 12.7 10*3/uL — ABNORMAL HIGH (ref 4.0–10.5)
nRBC: 1.2 % — ABNORMAL HIGH (ref 0.0–0.2)

## 2018-04-27 LAB — COMPREHENSIVE METABOLIC PANEL
ALBUMIN: 2 g/dL — AB (ref 3.5–5.0)
ALT: 97 U/L — ABNORMAL HIGH (ref 0–44)
AST: 129 U/L — ABNORMAL HIGH (ref 15–41)
Alkaline Phosphatase: 52 U/L (ref 38–126)
Anion gap: 6 (ref 5–15)
BUN: 36 mg/dL — ABNORMAL HIGH (ref 6–20)
CO2: 24 mmol/L (ref 22–32)
Calcium: 7.3 mg/dL — ABNORMAL LOW (ref 8.9–10.3)
Chloride: 109 mmol/L (ref 98–111)
Creatinine, Ser: 2.78 mg/dL — ABNORMAL HIGH (ref 0.61–1.24)
GFR calc Af Amer: 33 mL/min — ABNORMAL LOW (ref >60)
GFR calc non Af Amer: 29 mL/min — ABNORMAL LOW (ref >60)
GLUCOSE: 108 mg/dL — AB (ref 70–99)
Potassium: 4.5 mmol/L (ref 3.5–5.1)
Sodium: 139 mmol/L (ref 135–145)
Total Bilirubin: 1.2 mg/dL (ref 0.3–1.2)
Total Protein: 5.4 g/dL — ABNORMAL LOW (ref 6.5–8.1)

## 2018-04-27 LAB — TYPE AND SCREEN
ABO/RH(D): O POS
Antibody Screen: POSITIVE
DAT, IgG: POSITIVE
Unit division: 0
Unit division: 0
Unit division: 0

## 2018-04-27 LAB — CULTURE, RESPIRATORY

## 2018-04-27 LAB — CULTURE, RESPIRATORY W GRAM STAIN
Culture: NORMAL
Gram Stain: NONE SEEN

## 2018-04-27 MED ORDER — OXYCODONE HCL 5 MG PO TABS: 15.0000 mg | ORAL_TABLET | Freq: Four times a day (QID) | ORAL | Status: DC | PRN

## 2018-04-27 MED ORDER — HYDROMORPHONE 1 MG/ML IV SOLN
INTRAVENOUS | Status: DC
  Filled 2018-04-27: qty 25

## 2018-04-27 MED ORDER — AZITHROMYCIN 250 MG PO TABS
500.0000 mg | ORAL_TABLET | Freq: Every day | ORAL | Status: AC
  Administered 2018-04-27 – 2018-04-29 (×3): 500 mg via ORAL
  Filled 2018-04-27 (×3): qty 2

## 2018-04-27 MED ORDER — ACETAMINOPHEN 500 MG PO TABS
1000.0000 mg | ORAL_TABLET | Freq: Four times a day (QID) | ORAL | Status: DC | PRN
  Administered 2018-04-27: 1000 mg via ORAL

## 2018-04-27 NOTE — Progress Notes (Addendum)
Subjective: Eric Rivera, a 32 year old male with a medical history significant for sickle cell anemia and chronic pain syndrome was admitted and sickle cell crisis.  Patient states that he was visiting family for Thanksgiving and developed sickle cell pain crisis.  Patient is from [place].  Patient is S/P 2 units of PRBCs.  Hemoglobin is only increased to 6.2, which is decreased from yesterday.  Patient states that his pain is worse today being that his PCA was adjusted by night RN and he was only receiving 1 mg/h.  His pain intensity is 7-8/10 primarily to mid chest.  Patient continues to cough persistently.  He endorses productive cough.  Patient afebrile.  He denies headache, shortness of breath, heart palpitations, dysuria, nausea, vomiting, or diarrhea.  Patient is able to ambulate in room without assistance.  Objective:  Vital signs in last 24 hours:  Vitals:   04/27/18 0526 04/27/18 0649 04/27/18 0752 04/27/18 0800  BP: 131/75  130/71   Pulse: 71  66   Resp: 16 12 12 11   Temp: 98.7 F (37.1 C)  99.2 F (37.3 C)   TempSrc: Oral  Oral   SpO2: 94% 95% 95% 96%  Weight:      Height:        Intake/Output from previous day:   Intake/Output Summary (Last 24 hours) at 04/27/2018 1030 Last data filed at 04/27/2018 1000 Gross per 24 hour  Intake 10 ml  Output 2000 ml  Net -1990 ml   Physical Exam  Constitutional: He is oriented to person, place, and time. He appears well-developed and well-nourished. He appears distressed.  Eyes: Pupils are equal, round, and reactive to light. Scleral icterus is present.  Neck: Normal range of motion.  Cardiovascular: Normal rate, regular rhythm and normal heart sounds.  Pulmonary/Chest: Effort normal. No tachypnea. No respiratory distress. He has rhonchi in the right upper field, the right lower field, the left upper field and the left lower field.  Abdominal: Soft. Bowel sounds are normal.  Neurological: He is alert and oriented to person,  place, and time.  Skin: Skin is warm and dry.  Psychiatric: He has a normal mood and affect. His behavior is normal. Judgment and thought content normal.    Lab Results:  Basic Metabolic Panel:    Component Value Date/Time   NA 139 04/27/2018 0532   K 4.5 04/27/2018 0532   CL 109 04/27/2018 0532   CO2 24 04/27/2018 0532   BUN 36 (H) 04/27/2018 0532   CREATININE 2.78 (H) 04/27/2018 0532   GLUCOSE 108 (H) 04/27/2018 0532   CALCIUM 7.3 (L) 04/27/2018 0532   CBC:    Component Value Date/Time   WBC 12.7 (H) 04/27/2018 0532   HGB 6.2 (LL) 04/27/2018 0532   HCT 18.7 (L) 04/27/2018 0532   PLT 343 04/27/2018 0532   MCV 89.9 04/27/2018 0532   NEUTROABS 8.0 (H) 04/27/2018 0532   LYMPHSABS 2.3 04/27/2018 0532   MONOABS 1.2 (H) 04/27/2018 0532   EOSABS 1.1 (H) 04/27/2018 0532   BASOSABS 0.1 04/27/2018 0532    Recent Results (from the past 240 hour(s))  Culture, blood (routine x 2)     Status: None (Preliminary result)   Collection Time: 04/24/18 12:45 PM  Result Value Ref Range Status   Specimen Description BLOOD RIGHT ANTECUBITAL  Final   Special Requests   Final    BOTTLES DRAWN AEROBIC ONLY Blood Culture results may not be optimal due to an inadequate volume of blood received in culture bottles  Culture   Final    NO GROWTH 2 DAYS Performed at [facility], [address] [place], [place] [address]    Report Status PENDING  Incomplete  Culture, blood (routine x 2)     Status: None (Preliminary result)   Collection Time: 04/24/18 12:46 PM  Result Value Ref Range Status   Specimen Description   Final    BLOOD LEFT HAND Performed at [facility], [address] [place], [place] [address]    Special Requests   Final    BOTTLES DRAWN AEROBIC ONLY Blood Culture results may not be optimal due to an inadequate volume of blood received in culture bottles   Culture   Final    NO GROWTH 2 DAYS Performed at [facility], [address]  [place], [place] [address]    Report Status PENDING  Incomplete  Culture, Urine     Status: None   Collection Time: 04/24/18  5:36 PM  Result Value Ref Range Status   Specimen Description   Final    URINE, CLEAN CATCH Performed at [facility], [address] [place], [place] [address]    Special Requests   Final    NONE Performed at [facility], [address] [place], [place] [address]    Culture   Final    NO GROWTH Performed at [facility], [address] [place], [place] [address]    Report Status 04/26/2018 FINAL  Final  Expectorated sputum assessment w rflx to resp cult     Status: None   Collection Time: 04/24/18  5:36 PM  Result Value Ref Range Status   Specimen Description EXPECTORATED SPUTUM  Final   Special Requests NONE  Final   Sputum evaluation   Final    THIS SPECIMEN IS ACCEPTABLE FOR SPUTUM CULTURE Performed at [facility], [address] [place], [place] [address]    Report Status 04/24/2018 FINAL  Final  Culture, respiratory     Status: None (Preliminary result)   Collection Time: 04/24/18  5:36 PM  Result Value Ref Range Status   Specimen Description   Final    EXPECTORATED SPUTUM Performed at [facility], [address] [place], [place] [address]    Special Requests   Final    NONE Reflexed from [id number] Performed at [facility], [address] [place], [place] [address]    Gram Stain NO WBC SEEN NO ORGANISMS SEEN   Final   Culture   Final    FEW Consistent with normal respiratory flora. Performed at [facility], [address] [place], [place] [address]    Report Status PENDING  Incomplete    Studies/Results: No results found.  Medications: Scheduled Meds: . cholecalciferol  2,000 Units Oral Daily  . enoxaparin (LOVENOX) injection  40 mg Subcutaneous Q24H  . folic acid  1 mg Oral Daily  . guaiFENesin-dextromethorphan  10 mL  Oral Q4H  . HYDROmorphone   Intravenous Q4H  . methadone  10 mg Oral Daily  . multivitamin with minerals  1 tablet Oral Daily  . senna-docusate  1 tablet Oral BID   Continuous Infusions: . sodium chloride 100 mL/hr at 04/27/18 1025  . diphenhydrAMINE 25 mg (04/25/18 0032)   PRN Meds:.diphenhydrAMINE, diphenhydrAMINE, naloxone **AND** sodium chloride flush, ondansetron (ZOFRAN) IV, polyethylene glycol, sodium chloride flush, sodium chloride flush  Consultants:  Transfused 2 units of PRBCs  Antibiotics:  Vancomycin and cefepime discontinued on 04/26/2018.  Will start azithromycin 500 mg daily.  Assessment/Plan: Principal Problem:   Sickle cell pain crisis (HCC) Active Problems:   Chest pain   Sickle cell anemia (HCC)   Acute chest syndrome (HCC)  Sickle cell anemia with pain crisis: Hypotonic IV fluids to 100 mL/h Continue weight-based Dilaudid PCA.  Will start to wean from PCA later on this afternoon Continue to hold IV Toradol, due to acute kidney injury  Acute kidney injury: Serum creatinine continues to be elevated to 2.78.  Empiric antibiotics discontinued on 04/26/2018 Continue gentle hydration and follow BMP in a.m. Refrain from all nephrotoxic agents  Leukocytosis: Possible ACS vs. URI. IV antibiotics discontinued d/t AKI. Blood cultures continue to be negative. Rhonchi on P/E, continue Azithromycin 500 mg daily.  Productive cough: Previous CT showed no evidence of pulmonary embolism.  Patient has one in all lobes on PE, will continue azithromycin 500 mg daily.  Also, continue Robitussin for expectoration.  Patient encouraged to increase oral fluid intake  Anemia of chronic disease: Status post blood transfusion, hemoglobin is 6.2 today.  Will decrease IV fluids due to possible hemodilution.  Will follow CBC in a.m. Chronic pain syndrome: Continue home medications  Chronic bilateral leg ulcers: No current infection, continue dressing changes.  Hold Lasix due to  crisis.  Hypokalemia: Resolved.  Potassium 4.5  Transaminitis Aminotransferases markedly elevated, reactive.  Continue to monitor closely  Code Status: Full Code Family Communication: N/A Disposition Plan: Not yet ready for discharge  Nolon Nations  APRN, MSN, FNP-C [facility] [address] [place], [place] [address] [phone]  If 5PM-7AM, please contact night-coverage.  04/27/2018, 10:30 AM  LOS: 4 days

## 2018-04-28 LAB — CBC
HEMATOCRIT: 18.1 % — AB (ref 39.0–52.0)
Hemoglobin: 6 g/dL — CL (ref 13.0–17.0)
MCH: 29.4 pg (ref 26.0–34.0)
MCHC: 33.1 g/dL (ref 30.0–36.0)
MCV: 88.7 fL (ref 80.0–100.0)
Platelets: 370 10*3/uL (ref 150–400)
RBC: 2.04 MIL/uL — ABNORMAL LOW (ref 4.22–5.81)
RDW: 19.9 % — AB (ref 11.5–15.5)
WBC: 11.6 10*3/uL — ABNORMAL HIGH (ref 4.0–10.5)
nRBC: 1.3 % — ABNORMAL HIGH (ref 0.0–0.2)

## 2018-04-28 LAB — BASIC METABOLIC PANEL
Anion gap: 8 (ref 5–15)
BUN: 36 mg/dL — ABNORMAL HIGH (ref 6–20)
CALCIUM: 7.3 mg/dL — AB (ref 8.9–10.3)
CO2: 21 mmol/L — ABNORMAL LOW (ref 22–32)
Chloride: 112 mmol/L — ABNORMAL HIGH (ref 98–111)
Creatinine, Ser: 2.5 mg/dL — ABNORMAL HIGH (ref 0.61–1.24)
GFR calc Af Amer: 38 mL/min — ABNORMAL LOW (ref >60)
GFR calc non Af Amer: 33 mL/min — ABNORMAL LOW (ref >60)
Glucose, Bld: 99 mg/dL (ref 70–99)
Potassium: 4.2 mmol/L (ref 3.5–5.1)
Sodium: 141 mmol/L (ref 135–145)

## 2018-04-28 LAB — PREPARE RBC (CROSSMATCH)

## 2018-04-28 MED ORDER — CAMPHOR-MENTHOL 0.5-0.5 % EX LOTN
TOPICAL_LOTION | CUTANEOUS | Status: DC | PRN
  Filled 2018-04-28: qty 222

## 2018-04-28 MED ORDER — SODIUM CHLORIDE 0.9% IV SOLUTION
Freq: Once | INTRAVENOUS | Status: AC
  Administered 2018-04-28: 17:00:00 via INTRAVENOUS

## 2018-04-28 MED ORDER — DIPHENHYDRAMINE HCL 25 MG PO CAPS
25.0000 mg | ORAL_CAPSULE | Freq: Once | ORAL | Status: AC
  Administered 2018-04-28: 25 mg via ORAL
  Filled 2018-04-28: qty 1

## 2018-04-28 MED ORDER — ACETAMINOPHEN 325 MG PO TABS
650.0000 mg | ORAL_TABLET | Freq: Once | ORAL | Status: AC
  Administered 2018-04-28: 650 mg via ORAL
  Filled 2018-04-28: qty 2

## 2018-04-28 MED ORDER — HYDROMORPHONE 1 MG/ML IV SOLN
INTRAVENOUS | Status: DC
  Administered 2018-04-28: 15 mg via INTRAVENOUS
  Administered 2018-04-28 – 2018-04-29 (×2): via INTRAVENOUS
  Administered 2018-04-29: 9 mg via INTRAVENOUS
  Administered 2018-04-29: 6 mg via INTRAVENOUS
  Administered 2018-04-29: 22:00:00 via INTRAVENOUS
  Administered 2018-04-29: 10.5 mg via INTRAVENOUS
  Administered 2018-04-30: 15.5 mg via INTRAVENOUS
  Filled 2018-04-28 (×2): qty 30
  Filled 2018-04-28 (×2): qty 25
  Filled 2018-04-28 (×2): qty 30
  Filled 2018-04-28: qty 25

## 2018-04-28 NOTE — Progress Notes (Signed)
Subjective: Eric Rivera, a 32 year old male with a medical history significant for sickle cell anemia and chronic pain syndrome was admitted and sickle cell crisis.     Patient is S/P 2 units of PRBCs. Today, hemoglobin has decreased to 6.0. Patients baseline is 7.0-8.0.  Patient says that pain intensity has improved some overnight. Pain is localized to mid-chest and is non radiating. Patient says that productive cough has improved.  Patient afebrile.  He denies headache, shortness of breath, heart palpitations, dysuria, nausea, vomiting, or diarrhea.  Patient is able to ambulate in room without assistance.  Objective:  Vital signs in last 24 hours:  Vitals:   04/28/18 0035 04/28/18 0412 04/28/18 0446 04/28/18 0711  BP: 129/71 132/81    Pulse: (!) 59 61    Resp: 18 18 14 11   Temp: 98.4 F (36.9 C) 98.4 F (36.9 C)    TempSrc: Oral Oral    SpO2: 98% 100% 100%   Weight:      Height:        Intake/Output from previous day:   Intake/Output Summary (Last 24 hours) at 04/28/2018 0908 Last data filed at 04/28/2018 1610 Gross per 24 hour  Intake 2323.4 ml  Output 2175 ml  Net 148.4 ml   Physical Exam  Constitutional: He is oriented to person, place, and time. He appears well-developed and well-nourished.  Eyes: Pupils are equal, round, and reactive to light. Scleral icterus is present.  Neck: Normal range of motion.  Cardiovascular: Normal rate, regular rhythm and normal heart sounds.  Pulmonary/Chest: Effort normal. No tachypnea. No respiratory distress. He has rhonchi in the right upper field, the right lower field, the left upper field and the left lower field.  Abdominal: Soft. Bowel sounds are normal.  Neurological: He is alert and oriented to person, place, and time.  Skin: Skin is warm and dry. Rash noted.  Skin dry and flaking  Bilateral leg ulcers  Psychiatric: He has a normal mood and affect. His behavior is normal. Judgment and thought content normal.    Lab  Results:  Basic Metabolic Panel:    Component Value Date/Time   NA 141 04/28/2018 0410   K 4.2 04/28/2018 0410   CL 112 (H) 04/28/2018 0410   CO2 21 (L) 04/28/2018 0410   BUN 36 (H) 04/28/2018 0410   CREATININE 2.50 (H) 04/28/2018 0410   GLUCOSE 99 04/28/2018 0410   CALCIUM 7.3 (L) 04/28/2018 0410   CBC:    Component Value Date/Time   WBC 11.6 (H) 04/28/2018 0410   HGB 6.0 (LL) 04/28/2018 0410   HCT 18.1 (L) 04/28/2018 0410   PLT 370 04/28/2018 0410   MCV 88.7 04/28/2018 0410   NEUTROABS 8.0 (H) 04/27/2018 0532   LYMPHSABS 2.3 04/27/2018 0532   MONOABS 1.2 (H) 04/27/2018 0532   EOSABS 1.1 (H) 04/27/2018 0532   BASOSABS 0.1 04/27/2018 0532    Recent Results (from the past 240 hour(s))  Culture, blood (routine x 2)     Status: None (Preliminary result)   Collection Time: 04/24/18 12:45 PM  Result Value Ref Range Status   Specimen Description BLOOD RIGHT ANTECUBITAL  Final   Special Requests   Final    BOTTLES DRAWN AEROBIC ONLY Blood Culture results may not be optimal due to an inadequate volume of blood received in culture bottles   Culture   Final    NO GROWTH 4 DAYS Performed at [facility], [address], [place], [place] [address]    Report  Status PENDING  Incomplete  Culture, blood (routine x 2)     Status: None (Preliminary result)   Collection Time: 04/24/18 12:46 PM  Result Value Ref Range Status   Specimen Description   Final    BLOOD LEFT HAND Performed at [facility], [address] [place], [place] [address]    Special Requests   Final    BOTTLES DRAWN AEROBIC ONLY Blood Culture results may not be optimal due to an inadequate volume of blood received in culture bottles   Culture   Final    NO GROWTH 4 DAYS Performed at [facility], [address] [place], [place] [address]    Report Status PENDING  Incomplete  Culture, Urine     Status: None   Collection Time: 04/24/18  5:36 PM  Result Value Ref Range Status    Specimen Description   Final    URINE, CLEAN CATCH Performed at [facility], [address], [place], [place] [address]    Special Requests   Final    NONE Performed at [facility], [address] [place], [place] [address]    Culture   Final    NO GROWTH Performed at [facility], [address], [place], [place] [address]    Report Status 04/26/2018 FINAL  Final  Expectorated sputum assessment w rflx to resp cult     Status: None   Collection Time: 04/24/18  5:36 PM  Result Value Ref Range Status   Specimen Description EXPECTORATED SPUTUM  Final   Special Requests NONE  Final   Sputum evaluation   Final    THIS SPECIMEN IS ACCEPTABLE FOR SPUTUM CULTURE Performed at [facility], [address], [place], [place] [address]    Report Status 04/24/2018 FINAL  Final  Culture, respiratory     Status: None   Collection Time: 04/24/18  5:36 PM  Result Value Ref Range Status   Specimen Description   Final    EXPECTORATED SPUTUM Performed at [facility], [address] [place], [place] [address]    Special Requests   Final    NONE Reflexed from [id number] Performed at [facility], [address], [place], [place] [address]    Gram Stain NO WBC SEEN NO ORGANISMS SEEN   Final   Culture   Final    FEW Consistent with normal respiratory flora. Performed at [facility], [address], [place], [place] [address]    Report Status 04/27/2018 FINAL  Final    Studies/Results: No results found.  Medications: Scheduled Meds: . sodium chloride   Intravenous Once  . acetaminophen  650 mg Oral Once  . azithromycin  500 mg Oral Daily  . cholecalciferol  2,000 Units Oral Daily  . diphenhydrAMINE  25 mg Oral Once  . enoxaparin (LOVENOX) injection  40 mg Subcutaneous Q24H  . folic acid  1 mg Oral Daily  . guaiFENesin-dextromethorphan  10 mL Oral Q4H  . HYDROmorphone    Intravenous Q4H  . methadone  10 mg Oral Daily  . multivitamin with minerals  1 tablet Oral Daily  . senna-docusate  1 tablet Oral BID   Continuous Infusions: . sodium chloride 100 mL/hr at 04/28/18 0542  . diphenhydrAMINE 25 mg (04/25/18 0032)   PRN Meds:.acetaminophen, camphor-menthol, diphenhydrAMINE, diphenhydrAMINE, naloxone **AND** sodium chloride flush, ondansetron (ZOFRAN) IV, oxyCODONE, polyethylene glycol, sodium chloride flush, sodium chloride flush  Consultants:  Transfused 2 units of PRBCs   Antibiotics:  Continue Azithromycin 500 mg daily  Assessment/Plan: Principal Problem:   Sickle cell pain crisis (HCC) Active Problems:   Chest pain   Sickle cell anemia (HCC)   Acute chest syndrome (HCC)  Sickle cell anemia with pain crisis: Decreased IV fluids to 50 mL/h Continue to wean PCA dilaudid.  Continue to hold IV Toradol, due to acute kidney injury  Acute kidney injury: Serum creatinine improved from 2.78 to 2.50.   Empiric antibiotics discontinued on 04/26/2018 Continue gentle hydration and follow BMP in a.m. Refrain from all nephrotoxic agents  Leukocytosis: Possible ACS vs. URI. IV antibiotics discontinued d/t AKI. Blood cultures continue to be negative. Rhonchi on P/E, continue Azithromycin 500 mg daily.  Productive cough: Previous CT showed no evidence of pulmonary embolism.  Patient has one in all lobes on PE, will continue azithromycin 500 mg daily.  Also, continue Robitussin for expectoration.  Patient encouraged to increase oral fluid intake  Anemia of chronic disease: Status post blood transfusion, hemoglobin is 6.0 today.  IVFs decreased on 04/27/2018. Will transfuse 1 unit of packed red blood cells.  CBC in am  Chronic pain syndrome: Continue home medications  Chronic bilateral leg ulcers: No current infection, continue dressing changes.  Hold Lasix due to crisis.  Hypokalemia: Resolved.  Potassium 4.5  Transaminitis Aminotransferases  markedly elevated, reactive.  Continue to monitor closely  Code Status: Full Code Family Communication: N/A Disposition Plan: Discharge planned for 04/29/2018  Nolon NationsLachina Moore Leeza Heiner  APRN, MSN, FNP-C Patient Care [facility] [address]  [place], [place] [id number] [phone]  If 5PM-7AM, please contact night-coverage.  04/28/2018, 9:08 AM  LOS: 5 days

## 2018-04-29 LAB — COMPREHENSIVE METABOLIC PANEL
ALT: 78 U/L — ABNORMAL HIGH (ref 0–44)
AST: 77 U/L — ABNORMAL HIGH (ref 15–41)
Albumin: 2.1 g/dL — ABNORMAL LOW (ref 3.5–5.0)
Alkaline Phosphatase: 55 U/L (ref 38–126)
Anion gap: 7 (ref 5–15)
BUN: 32 mg/dL — ABNORMAL HIGH (ref 6–20)
CO2: 23 mmol/L (ref 22–32)
Calcium: 7.6 mg/dL — ABNORMAL LOW (ref 8.9–10.3)
Chloride: 111 mmol/L (ref 98–111)
Creatinine, Ser: 2.37 mg/dL — ABNORMAL HIGH (ref 0.61–1.24)
GFR calc non Af Amer: 35 mL/min — ABNORMAL LOW (ref >60)
GFR, EST AFRICAN AMERICAN: 40 mL/min — AB (ref >60)
Glucose, Bld: 101 mg/dL — ABNORMAL HIGH (ref 70–99)
Potassium: 4.1 mmol/L (ref 3.5–5.1)
Sodium: 141 mmol/L (ref 135–145)
TOTAL PROTEIN: 5.4 g/dL — AB (ref 6.5–8.1)
Total Bilirubin: 1.1 mg/dL (ref 0.3–1.2)

## 2018-04-29 LAB — CBC
HCT: 20.2 % — ABNORMAL LOW (ref 39.0–52.0)
Hemoglobin: 6.5 g/dL — CL (ref 13.0–17.0)
MCH: 28.9 pg (ref 26.0–34.0)
MCHC: 32.2 g/dL (ref 30.0–36.0)
MCV: 89.8 fL (ref 80.0–100.0)
NRBC: 1 % — AB (ref 0.0–0.2)
Platelets: 411 10*3/uL — ABNORMAL HIGH (ref 150–400)
RBC: 2.25 MIL/uL — ABNORMAL LOW (ref 4.22–5.81)
RDW: 20.4 % — ABNORMAL HIGH (ref 11.5–15.5)
WBC: 15.1 10*3/uL — AB (ref 4.0–10.5)

## 2018-04-29 LAB — CULTURE, BLOOD (ROUTINE X 2)
CULTURE: NO GROWTH
Culture: NO GROWTH

## 2018-04-29 MED ORDER — LACTULOSE 10 GM/15ML PO SOLN
30.0000 g | Freq: Every day | ORAL | Status: DC | PRN
  Administered 2018-04-29: 30 g via ORAL
  Filled 2018-04-29: qty 45

## 2018-04-29 MED ORDER — LACTULOSE 10 GM/15ML PO SOLN
30.0000 g | Freq: Once | ORAL | Status: AC
  Administered 2018-04-29: 30 g via ORAL
  Filled 2018-04-29: qty 45

## 2018-04-29 NOTE — Plan of Care (Signed)
  Problem: Nutrition: Goal: Adequate nutrition will be maintained Outcome: Progressing   Problem: Safety: Goal: Ability to remain free from injury will improve Outcome: Progressing   Problem: Bowel/Gastric: Goal: Gut motility will be maintained Outcome: Progressing

## 2018-04-29 NOTE — Progress Notes (Signed)
Pt. Refused to have PAC needles changed today. Informed pt. Of the importance of changing them out every 7 days. He still refused. RN Addison Naegeliekina made aware.

## 2018-04-29 NOTE — Progress Notes (Signed)
Patient ID: Eric Rivera, male   DOB: March 21, 1986, 32 y.o.   MRN: [id number] Subjective:   Patient has no new complaint today, states pain is improving and tolerable, would like to go home to [place], the family will be traveling by road, about 12 hours drive, patient currently feels he needs to have bowel movement because of abdominal bloating and constipation before he embarks on the journey.  Family concerned that he may not be ready because of cough and ongoing pain, he denies any chest pain, no headache, no shortness of breath, no nausea, no vomiting.  Objective:  Vital signs in last 24 hours:  Vitals:   04/29/18 0919 04/29/18 1200 04/29/18 1234 04/29/18 1357  BP:    (!) 141/83  Pulse:    64  Resp: 12  13 11   Temp:    98.8 F (37.1 C)  TempSrc:    Oral  SpO2: 99% 98% 96% 96%  Weight:      Height:        Intake/Output from previous day:   Intake/Output Summary (Last 24 hours) at 04/29/2018 1659 Last data filed at 04/29/2018 1500 Gross per 24 hour  Intake 2673 ml  Output 3100 ml  Net -427 ml    Physical Exam: General: Alert, awake, oriented x3, in no acute distress.  HEENT: [place]/AT PEERL, EOMI Neck: Trachea midline,  no masses, no thyromegal,y no JVD, no carotid bruit OROPHARYNX:  Moist, No exudate/ erythema/lesions.  Heart: Regular rate and rhythm, without murmurs, rubs, gallops, PMI non-displaced, no heaves or thrills on palpation.  Lungs: Few rhonchi noted. No increased vocal fremitus resonant to percussion  Abdomen: Soft, nontender, nondistended, positive bowel sounds, no masses no hepatosplenomegaly noted..  Neuro: No focal neurological deficits noted cranial nerves II through XII grossly intact. DTRs 2+ bilaterally upper and lower extremities. Strength 5 out of 5 in bilateral upper and lower extremities. Musculoskeletal: Bilateral lower limb ulcers Psychiatric: Patient alert and oriented x3, good insight and cognition, good recent to remote recall. Lymph node  survey: No cervical axillary or inguinal lymphadenopathy noted.  Lab Results:  Basic Metabolic Panel:    Component Value Date/Time   NA 141 04/29/2018 0444   K 4.1 04/29/2018 0444   CL 111 04/29/2018 0444   CO2 23 04/29/2018 0444   BUN 32 (H) 04/29/2018 0444   CREATININE 2.37 (H) 04/29/2018 0444   GLUCOSE 101 (H) 04/29/2018 0444   CALCIUM 7.6 (L) 04/29/2018 0444   CBC:    Component Value Date/Time   WBC 15.1 (H) 04/29/2018 0444   HGB 6.5 (LL) 04/29/2018 0444   HCT 20.2 (L) 04/29/2018 0444   PLT 411 (H) 04/29/2018 0444   MCV 89.8 04/29/2018 0444   NEUTROABS 8.0 (H) 04/27/2018 0532   LYMPHSABS 2.3 04/27/2018 0532   MONOABS 1.2 (H) 04/27/2018 0532   EOSABS 1.1 (H) 04/27/2018 0532   BASOSABS 0.1 04/27/2018 0532    Recent Results (from the past 240 hour(s))  Culture, blood (routine x 2)     Status: None   Collection Time: 04/24/18 12:45 PM  Result Value Ref Range Status   Specimen Description BLOOD RIGHT ANTECUBITAL  Final   Special Requests   Final    BOTTLES DRAWN AEROBIC ONLY Blood Culture results may not be optimal due to an inadequate volume of blood received in culture bottles   Culture   Final    NO GROWTH 5 DAYS Performed at [facility], [address] [place], [place] [address]  Report Status 04/29/2018 FINAL  Final  Culture, blood (routine x 2)     Status: None   Collection Time: 04/24/18 12:46 PM  Result Value Ref Range Status   Specimen Description   Final    BLOOD LEFT HAND Performed at [facility], [address], [place], [place] [address]    Special Requests   Final    BOTTLES DRAWN AEROBIC ONLY Blood Culture results may not be optimal due to an inadequate volume of blood received in culture bottles   Culture   Final    NO GROWTH 5 DAYS Performed at [facility], [address] [place], [place] [address]    Report Status 04/29/2018 FINAL  Final  Culture, Urine     Status: None   Collection Time: 04/24/18  5:36  PM  Result Value Ref Range Status   Specimen Description   Final    URINE, CLEAN CATCH Performed at [facility], [address], [place], [place] [address]    Special Requests   Final    NONE Performed at [facility], [address], [place], [place] [address]    Culture   Final    NO GROWTH Performed at [facility], [address], [place], [place] [address]    Report Status 04/26/2018 FINAL  Final  Expectorated sputum assessment w rflx to resp cult     Status: None   Collection Time: 04/24/18  5:36 PM  Result Value Ref Range Status   Specimen Description EXPECTORATED SPUTUM  Final   Special Requests NONE  Final   Sputum evaluation   Final    THIS SPECIMEN IS ACCEPTABLE FOR SPUTUM CULTURE Performed at [facility], [address], [place], [place] [address]    Report Status 04/24/2018 FINAL  Final  Culture, respiratory     Status: None   Collection Time: 04/24/18  5:36 PM  Result Value Ref Range Status   Specimen Description   Final    EXPECTORATED SPUTUM Performed at [facility], [address] [place], [place] [address]    Special Requests   Final    NONE Reflexed from [id number] Performed at [facility], [address], [place], [place] [address]    Gram Stain NO WBC SEEN NO ORGANISMS SEEN   Final   Culture   Final    FEW Consistent with normal respiratory flora. Performed at [facility], [address], [place], [place] [address]    Report Status 04/27/2018 FINAL  Final    Studies/Results: No results found.  Medications: Scheduled Meds: . cholecalciferol  2,000 Units Oral Daily  . enoxaparin (LOVENOX) injection  40 mg Subcutaneous Q24H  . folic acid  1 mg Oral Daily  . guaiFENesin-dextromethorphan  10 mL Oral Q4H  . HYDROmorphone   Intravenous Q4H  . methadone  10 mg Oral Daily  . multivitamin with minerals  1 tablet Oral Daily  . senna-docusate   1 tablet Oral BID   Continuous Infusions: . sodium chloride 50 mL/hr at 04/28/18 2219  . diphenhydrAMINE 25 mg (04/28/18 2219)   PRN Meds:.acetaminophen, camphor-menthol, diphenhydrAMINE, diphenhydrAMINE, lactulose, naloxone **AND** sodium chloride flush, ondansetron (ZOFRAN) IV, oxyCODONE, polyethylene glycol, sodium chloride flush, sodium chloride flush  Assessment/Plan: Principal Problem:   Sickle cell pain crisis (HCC) Active Problems:   Chest pain   Sickle cell anemia (HCC)   Acute chest syndrome (HCC)  1. Hb Sickle  Cell Disease with crisis:IVF D5 .45% Saline @ 50 cc/Hr, continue weight based Dilaudid PCA, IV Toradol  is contraindicated due to AKI, Monitor vitals very closely, Re-evaluate pain scale regularly, 2 L of Oxygen by [place]. 2. Acute Kidney Injury: Serum Creatinine is improving from 2.76, now 2.37, IVF @ 50cc/hr, refrain from all nephrotoxic's.  Continue to monitor. 3. Leukocytosis: Patient on azithromycin.Blood Cx and all cultures are negative. 4. Cough: CT Chest showed no evidence of PE or Active Lung Disease. Continueazithromycin and Robitussin for expectoration.  5. Sickle Cell Anemia: S/P blood transfusion of 4 units total, Hb is at6.5 today, willcontinue to monitor. 6. Chronic pain Syndrome:Continuehome medications.  Patient would like to be discharged today but feels constipated and would like to have a bowel movement before leaving the hospital. 7. Constipation: Add lactulose 30 mg p.o. 1 time. 8. Chronic Wounds - bilateral Lower Limb: No evidence of infection, continue Milic os and dressing, hold lasix for now because of crisis and possible dehydration.   Code Status: Full Code Family Communication: N/A Disposition Plan: Not yet ready for discharge  Lyan Moyano  If 7PM-7AM, please contact night-coverage.  04/29/2018, 4:59 PM  LOS: 6 days

## 2018-04-29 NOTE — Progress Notes (Signed)
CRITICAL VALUE ALERT  Critical Value:  hgb 6.5  Date & Time Notied:  0530  Provider Notified: yes  Orders Received/Actions taken: pending

## 2018-04-29 NOTE — Progress Notes (Signed)
CCMD notified RN that patient is having frequent PVCs. MD notified. No new orders at this time. RN will continue to monitor the patient.

## 2018-04-30 LAB — BASIC METABOLIC PANEL
Anion gap: 6 (ref 5–15)
BUN: 27 mg/dL — ABNORMAL HIGH (ref 6–20)
CO2: 22 mmol/L (ref 22–32)
Calcium: 7.6 mg/dL — ABNORMAL LOW (ref 8.9–10.3)
Chloride: 115 mmol/L — ABNORMAL HIGH (ref 98–111)
Creatinine, Ser: 2.19 mg/dL — ABNORMAL HIGH (ref 0.61–1.24)
GFR calc Af Amer: 44 mL/min — ABNORMAL LOW (ref >60)
GFR calc non Af Amer: 38 mL/min — ABNORMAL LOW (ref >60)
Glucose, Bld: 109 mg/dL — ABNORMAL HIGH (ref 70–99)
Potassium: 3.8 mmol/L (ref 3.5–5.1)
Sodium: 143 mmol/L (ref 135–145)

## 2018-04-30 LAB — CBC WITH DIFFERENTIAL/PLATELET
Abs Immature Granulocytes: 0.06 10*3/uL (ref 0.00–0.07)
Basophils Absolute: 0.1 10*3/uL (ref 0.0–0.1)
Basophils Relative: 0 %
Eosinophils Absolute: 0.7 10*3/uL — ABNORMAL HIGH (ref 0.0–0.5)
Eosinophils Relative: 4 %
HCT: 19.8 % — ABNORMAL LOW (ref 39.0–52.0)
Hemoglobin: 6.3 g/dL — CL (ref 13.0–17.0)
Immature Granulocytes: 0 %
Lymphocytes Relative: 21 %
Lymphs Abs: 3.3 10*3/uL (ref 0.7–4.0)
MCH: 28.4 pg (ref 26.0–34.0)
MCHC: 31.8 g/dL (ref 30.0–36.0)
MCV: 89.2 fL (ref 80.0–100.0)
Monocytes Absolute: 1.6 10*3/uL — ABNORMAL HIGH (ref 0.1–1.0)
Monocytes Relative: 10 %
NEUTROS ABS: 10.5 10*3/uL — AB (ref 1.7–7.7)
Neutrophils Relative %: 65 %
Platelets: 486 10*3/uL — ABNORMAL HIGH (ref 150–400)
RBC: 2.22 MIL/uL — ABNORMAL LOW (ref 4.22–5.81)
RDW: 20.9 % — ABNORMAL HIGH (ref 11.5–15.5)
WBC: 16.2 10*3/uL — ABNORMAL HIGH (ref 4.0–10.5)
nRBC: 0.6 % — ABNORMAL HIGH (ref 0.0–0.2)

## 2018-04-30 LAB — PREPARE RBC (CROSSMATCH)

## 2018-04-30 MED ORDER — SODIUM CHLORIDE 0.9% IV SOLUTION: Freq: Once | INTRAVENOUS | Status: DC

## 2018-04-30 MED ORDER — ONDANSETRON 4 MG PO TBDP
4.0000 mg | ORAL_TABLET | Freq: Once | ORAL | Status: AC
  Administered 2018-04-30: 4 mg via ORAL
  Filled 2018-04-30: qty 1

## 2018-04-30 MED ORDER — HEPARIN SOD (PORK) LOCK FLUSH 100 UNIT/ML IV SOLN
500.0000 [IU] | INTRAVENOUS | Status: AC | PRN
  Administered 2018-04-30: 500 [IU]

## 2018-04-30 NOTE — Discharge Summary (Signed)
Physician Discharge Summary  Eric Rivera ZOX:[id number] DOB: 04/16/1986 DOA: 04/22/2018  PCP: System, Pcp Not In  Admit date: 04/22/2018  Discharge date: 04/30/2018  Discharge Diagnoses:  Principal Problem:   Sickle cell pain crisis [facility]) Active Problems:   Chest pain   Sickle cell anemia (HCC)   Acute chest syndrome [facility])  Discharge Condition: Stable  Disposition:  Pt is discharged home in good condition and is to follow up with System, Pcp Not In this week to have labs evaluated. Eric Rivera is instructed to increase activity slowly and balance with rest for the next few days, and use prescribed medication to complete treatment of pain  Diet: Regular Wt Readings from Last 3 Encounters:  04/27/18 104.1 kg  03/23/16 93 kg   History of present illness:  Eric Rivera is a 32 y.o. male with history of sickle cell anemia who is traveling from [place] to [place] for visiting family started developing chest pain and shortness of breath over the last few hours.  Has had mild nonproductive cough with some subjective feeling of fever and chills.  Denies any headache visual symptoms nausea vomiting abdominal pain or diarrhea.  Was admitted last month for sickle cell pain crisis at that time patient received 4 units of PRBC as per the care everywhere notes.  ED Course: In the ER patient was tachycardic mildly febrile and was complaining of pleuritic type of chest pain.  EKG was showing sinus tachycardia troponin was negative. CT angiogram of the chest was negative for PE it showed some mediastinal lymphadenopathy and cardiomegaly.  Hemoglobin is around 4.9.  In care everywhere patient's baseline hemoglobin is around 6.  2 units of PRBC has been ordered.  Admitted for sickle cell pain crisis with possible acute chest syndrome for which patient has been started on empiric antibiotics.  Labs also showed leukocytosis.  Hospital Course:  Patient was admitted for sickle cell pain crisis and  possible acute chest syndrome and managed initially with IVF, IV Dilaudid via PCA and IV Toradol, blood transfusion of 1 unit packed red blood cell, as well as other adjunct therapies per sickle cell pain management protocols.  He was also started on antibiotics empirically for acute chest syndrome and possible upper respiratory infection.  While on admission he developed acute kidney injury possibly multifactorial, from dehydration, IV Toradol to sickle cell crisis and possibly IV vancomycin.  All of these were discontinued, patient was appropriately rehydrated, hemoglobin dropped to a nadir of 5.7, he got a total of 5 units of blood transfusion throughout this admission at different times, the last being today before discharge because his hemoglobin dropped again to 6.3. Kidney function improved although not to baseline but there was a downward trending of the serum creatinine level from highest of 2.78 to 2.19 today before discharge. Patient's pain responded well to the above regimen to the extent that he is able to ambulate well without restriction or pain as of today.  Patient continues to have productive cough for which he was placed on azithromycin and cough expectorant.  All cultures were negative, and CT chest was negative for PE or any interstitial disease.  Patient remained hemodynamically stable throughout this admission.  As of today he was found to be in good condition, eating and drinking well without restriction, ambulating well and vitals were within normal.  Patient and family requested discharge for them to get back home to [place].  All labs were reviewed, as stated above patient received another 1 unit of  packed red blood cells today before this discharge. He is advised to follow-up with primary care physician and has sickle cell disease specialist on Monday, May 03, 2018, for repeat comprehensive metabolic panel and CBC.  Patient was discharged home today in a hemodynamically stable  condition.   Discharge Exam: Vitals:   04/30/18 0640 04/30/18 0942  BP: 123/73 (!) 151/91  Pulse: 61 60  Resp: 18 11  Temp: 98.8 F (37.1 C) 98.8 F (37.1 C)  SpO2: 99%    Vitals:   04/30/18 0031 04/30/18 0443 04/30/18 0640 04/30/18 0942  BP: 136/80 119/69 123/73 (!) 151/91  Pulse: 61 65 61 60  Resp: 19 19 18 11   Temp: 99 F (37.2 C) 98.9 F (37.2 C) 98.8 F (37.1 C) 98.8 F (37.1 C)  TempSrc: Oral Oral Oral Oral  SpO2: 98% 99% 99%   Weight:      Height:       General appearance : Awake, alert, not in any distress. Speech Clear. Not toxic looking HEENT: Atraumatic and Normocephalic, pupils equally reactive to light and accomodation Neck: Supple, no JVD. No cervical lymphadenopathy.  Chest: Good air entry bilaterally, no added sounds  CVS: S1 S2 regular, no murmurs.  Abdomen: Bowel sounds present, Non tender and not distended with no gaurding, rigidity or rebound. Extremities: B/L Lower Ext ulcers, no evidence of infection Neurology: Awake alert, and oriented X 3, CN II-XII intact, Non focal Skin: Flaky  Discharge Instructions  Discharge Instructions    Diet - low sodium heart healthy   Complete by:  As directed    Increase activity slowly   Complete by:  As directed      Allergies as of 04/30/2018   No Known Allergies     Medication List    TAKE these medications   folic acid 1 MG tablet Commonly known as:  FOLVITE Take 1 mg by mouth daily.   furosemide 40 MG tablet Commonly known as:  LASIX Take 40 mg by mouth daily.   ibuprofen 200 MG tablet Commonly known as:  ADVIL,MOTRIN Take 400 mg by mouth every 6 (six) hours as needed for moderate pain.   methadone 10 MG tablet Commonly known as:  DOLOPHINE Take 10 mg by mouth every 8 (eight) hours as needed.   multivitamin with minerals Tabs tablet Take 1 tablet by mouth daily.   oxyCODONE 15 MG immediate release tablet Commonly known as:  ROXICODONE Take 15 mg by mouth every 4 (four) hours as  needed.   Vitamin D 50 MCG (2000 UT) tablet Take 2,000 Units by mouth daily.       The results of significant diagnostics from this hospitalization (including imaging, microbiology, ancillary and laboratory) are listed below for reference.    Significant Diagnostic Studies: Ct Angio Chest Pe W And/or Wo Contrast  Result Date: 04/23/2018 CLINICAL DATA:  Chest pain and shortness of breath. High pretest probability for pulmonary embolism. Sickle cell anemia. EXAM: CT ANGIOGRAPHY CHEST WITH CONTRAST TECHNIQUE: Multidetector CT imaging of the chest was performed using the standard protocol during bolus administration of intravenous contrast. Multiplanar CT image reconstructions and MIPs were obtained to evaluate the vascular anatomy. CONTRAST:  ISOVUE-370 IOPAMIDOL (ISOVUE-370) INJECTION 76% COMPARISON:  None FINDINGS: Cardiovascular: Somewhat suboptimal opacification of pulmonary arteries noted, however no pulmonary emboli identified. No evidence of thoracic aortic dissection or aneurysm. Mild-to-moderate cardiomegaly. Mediastinum/Nodes: Mild mediastinal lymphadenopathy is seen in the right paratracheal region and AP window. Largest lymph node measures 1.6 cm in the  AP window on image 49/5. No hilar lymphadenopathy identified. Lungs/Pleura: No pulmonary mass, infiltrate, or effusion. Upper abdomen: No acute findings. Musculoskeletal: No suspicious bone lesions identified. Review of the MIP images confirms the above findings. IMPRESSION: No evidence of pulmonary embolism or active lung disease. Mild mediastinal lymphadenopathy, which is nonspecific. Recommend continued follow-up by chest CT in 3-6 months. This recommendation follows ACR consensus guidelines: Managing Incidental Findings on Thoracic CT: Mediastinal and Cardiovascular Findings. A White Paper of the ACR Incidental Findings Committee. J Am Coll Radiol. 2018; 15: 1610-9604. Mild-to-moderate cardiomegaly. Electronically Signed   By: Myles Rosenthal M.D.   On: 04/23/2018 00:29   Dg Chest Port 1 View  Result Date: 04/22/2018 CLINICAL DATA:  Chest pain and shortness of breath. EXAM: PORTABLE CHEST 1 VIEW COMPARISON:  Frontal and lateral views 05/23/2016 FINDINGS: Left chest port with tip in the mid SVC. Cardiomegaly with question progression from prior exam versus differences in technique. Perihilar atelectasis and mild bronchial thickening. No confluent airspace disease. No pleural effusion or pneumothorax. No acute osseous abnormalities. IMPRESSION: 1. Cardiomegaly with question of progression from prior exam versus differences in technique. 2. Perihilar atelectasis and bronchial thickening. Electronically Signed   By: Narda Rutherford M.D.   On: 04/22/2018 22:26    Microbiology: Recent Results (from the past 240 hour(s))  Culture, blood (routine x 2)     Status: None   Collection Time: 04/24/18 12:45 PM  Result Value Ref Range Status   Specimen Description BLOOD RIGHT ANTECUBITAL  Final   Special Requests   Final    BOTTLES DRAWN AEROBIC ONLY Blood Culture results may not be optimal due to an inadequate volume of blood received in culture bottles   Culture   Final    NO GROWTH 5 DAYS Performed at [facility], [address] [place], [place] [address]    Report Status 04/29/2018 FINAL  Final  Culture, blood (routine x 2)     Status: None   Collection Time: 04/24/18 12:46 PM  Result Value Ref Range Status   Specimen Description   Final    BLOOD LEFT HAND Performed at [facility], [address], [place], [place] [address]    Special Requests   Final    BOTTLES DRAWN AEROBIC ONLY Blood Culture results may not be optimal due to an inadequate volume of blood received in culture bottles   Culture   Final    NO GROWTH 5 DAYS Performed at [facility], [address], [place], [place] [address]    Report Status 04/29/2018 FINAL  Final  Culture, Urine     Status: None   Collection Time:  04/24/18  5:36 PM  Result Value Ref Range Status   Specimen Description   Final    URINE, CLEAN CATCH Performed at [facility], [address], [place], [place] [address]    Special Requests   Final    NONE Performed at [facility], [address], [place], [place] [address]    Culture   Final    NO GROWTH Performed at [facility], [address], [place], [place] [address]    Report Status 04/26/2018 FINAL  Final  Expectorated sputum assessment w rflx to resp cult     Status: None   Collection Time: 04/24/18  5:36 PM  Result Value Ref Range Status   Specimen Description EXPECTORATED SPUTUM  Final   Special Requests NONE  Final  Sputum evaluation   Final    THIS SPECIMEN IS ACCEPTABLE FOR SPUTUM CULTURE Performed at [facility], [address] [place], [place] [address]    Report Status 04/24/2018 FINAL  Final  Culture, respiratory     Status: None   Collection Time: 04/24/18  5:36 PM  Result Value Ref Range Status   Specimen Description   Final    EXPECTORATED SPUTUM Performed at [facility], [address] [place], [place] [address]    Special Requests   Final    NONE Reflexed from [id number] Performed at [facility], [address] [place], [place] [address]    Gram Stain NO WBC SEEN NO ORGANISMS SEEN   Final   Culture   Final    FEW Consistent with normal respiratory flora. Performed at [facility], [address] [place], [place] [address]    Report Status 04/27/2018 FINAL  Final     Labs: Basic Metabolic Panel: Recent Labs  Lab 04/26/18 0650 04/27/18 0532 04/28/18 0410 04/29/18 0444 04/30/18 0429  NA 137 139 141 141 143  K 4.3 4.5 4.2 4.1 3.8  CL 107 109 112* 111 115*  CO2 24 24 21* 23 22  GLUCOSE 112* 108* 99 101* 109*  BUN 29* 36* 36* 32* 27*  CREATININE 2.76* 2.78* 2.50* 2.37* 2.19*  CALCIUM 7.2* 7.3* 7.3* 7.6* 7.6*   Liver Function  Tests: Recent Labs  Lab 04/26/18 0650 04/27/18 0532 04/29/18 0444  AST 137* 129* 77*  ALT 93* 97* 78*  ALKPHOS 49 52 55  BILITOT 1.5* 1.2 1.1  PROT 5.3* 5.4* 5.4*  ALBUMIN 2.1* 2.0* 2.1*   No results for input(s): LIPASE, AMYLASE in the last 168 hours. No results for input(s): AMMONIA in the last 168 hours. CBC: Recent Labs  Lab 04/24/18 1230 04/25/18 0349 04/26/18 0650 04/27/18 0532 04/28/18 0410 04/29/18 0444 04/30/18 0429  WBC 12.7* 11.4* 9.8 12.7* 11.6* 15.1* 16.2*  NEUTROABS 8.2* 6.5 5.2 8.0*  --   --  10.5*  HGB 6.4* 5.7* 6.5* 6.2* 6.0* 6.5* 6.3*  HCT 19.9* 17.8* 19.6* 18.7* 18.1* 20.2* 19.8*  MCV 93.0 92.7 90.3 89.9 88.7 89.8 89.2  PLT 489* 434* 344 343 370 411* 486*   Cardiac Enzymes: No results for input(s): CKTOTAL, CKMB, CKMBINDEX, TROPONINI in the last 168 hours. BNP: Invalid input(s): POCBNP CBG: No results for input(s): GLUCAP in the last 168 hours.  Time coordinating discharge: 50 minutes  Signed:  Auron Tadros  [facility] 04/30/2018, 10:23 AM

## 2018-04-30 NOTE — Progress Notes (Signed)
Wasted 3 ml of Dilaudid syringe with Ambulance personakiyha RN in stericycle

## 2018-04-30 NOTE — Care Management Note (Signed)
Case Management Note  Patient Details  Name: Aleatha BorerJessie Stipp MRN: [id number] Date of Birth: 23-May-1986  Subjective/Objective:                  Discharged  Action/Plan: Discharged to home with self-care, orders checked for hhc needs. No CM needs present at time of discharge.  Patient is able to arrangement own appointments and home care.  Expected Discharge Date:  04/30/18               Expected Discharge Plan:  Acute to Acute Transfer  In-House Referral:     Discharge planning Services  CM Consult  Post Acute Care Choice:    Choice offered to:     DME Arranged:    DME Agency:     HH Arranged:    HH Agency:     Status of Service:  In process, will continue to follow  If discussed at Long Length of Stay Meetings, dates discussed:    Additional Comments:  Golda AcreDavis, Phelan Schadt [place], RN 04/30/2018, 10:31 AM

## 2018-04-30 NOTE — Progress Notes (Signed)
CRITICAL VALUE ALERT  Critical Value:  hgb 6.3  Date & Time Notied:   0500  Provider Notified:Yes   Orders Received/Actions taken:yes

## 2018-05-02 LAB — TYPE AND SCREEN
ABO/RH(D): O POS
Antibody Screen: POSITIVE
DAT, IGG: POSITIVE
Unit division: 0
Unit division: 0

## 2018-05-02 LAB — BPAM RBC
BLOOD PRODUCT EXPIRATION DATE: 201912272359
Blood Product Expiration Date: 201912312359
ISSUE DATE / TIME: 201912041655
ISSUE DATE / TIME: 201912060634
Unit Type and Rh: 5100
Unit Type and Rh: 5100

## 2020-01-14 IMAGING — DX DG CHEST 1V PORT
1 series · 1 of 1 positions shown · non-contrast
Comparison: Frontal and lateral views 05/23/2016

CLINICAL DATA: Chest pain and shortness of breath.

EXAM:
PORTABLE CHEST 1 VIEW

[chest]
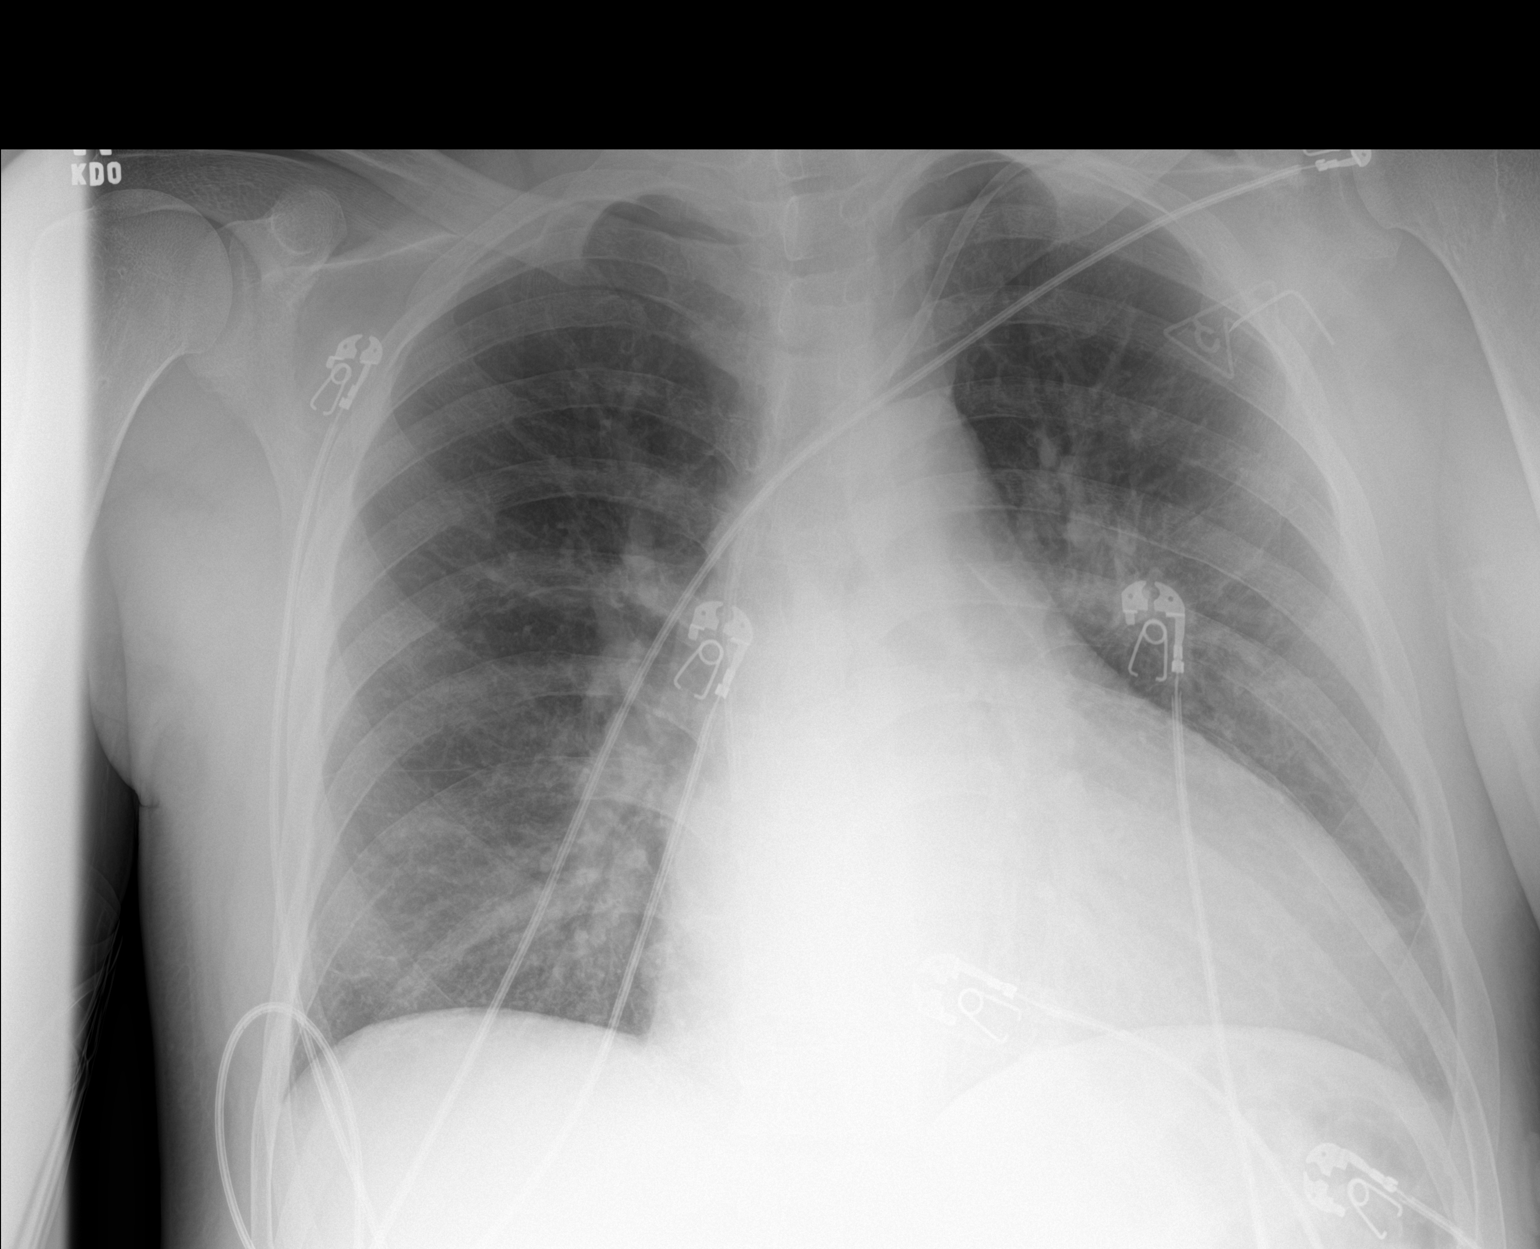

[1 of 1 positions shown; findings below may reference images not displayed]

FINDINGS: Left chest port with tip in the mid SVC. Cardiomegaly with question
progression from prior exam versus differences in technique.
Perihilar atelectasis and mild bronchial thickening. No confluent
airspace disease. No pleural effusion or pneumothorax. No acute
osseous abnormalities.
IMPRESSION: 1. Cardiomegaly with question of progression from prior exam versus
differences in technique.
2. Perihilar atelectasis and bronchial thickening.

## 2020-01-14 IMAGING — CT CT ANGIO CHEST
1 of 12 series · 9 of 38 positions shown · IV contrast (iopamidol)
Comparison: None

CLINICAL DATA: Chest pain and shortness of breath. High pretest
probability for pulmonary embolism. Sickle cell anemia.

EXAM:
CT ANGIOGRAPHY CHEST WITH CONTRAST
TECHNIQUE: Multidetector CT imaging of the chest was performed using the
standard protocol during bolus administration of intravenous
contrast. Multiplanar CT image reconstructions and MIPs were
obtained to evaluate the vascular anatomy.
CONTRAST:  100mL [id number] IOPAMIDOL ([id number]) INJECTION 76%

[Series 7: pe thins · axial · 0.81mm/px · z∈[-251,+1]mm · 9 of 450 slices shown]
[im 45/450  lung]
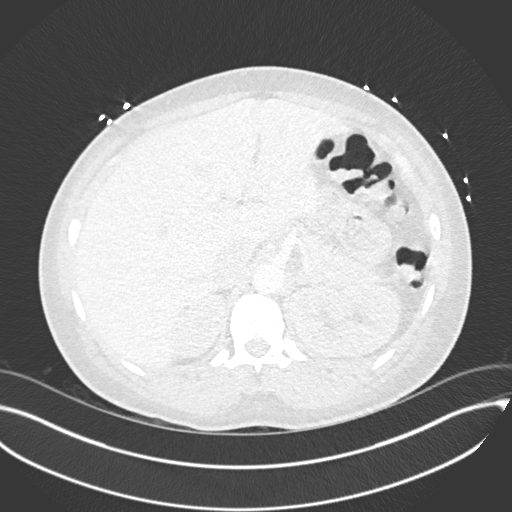
[im 90/450  mediastinal]
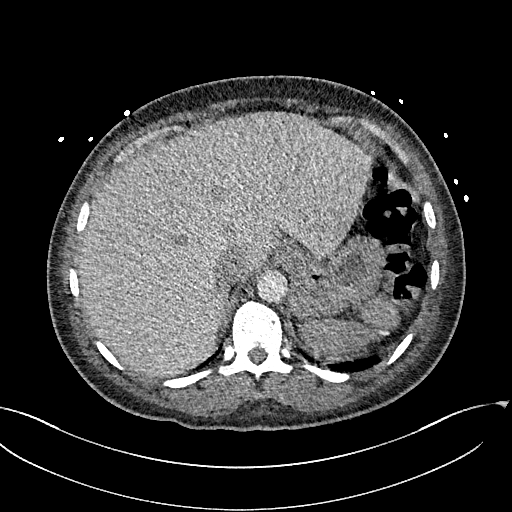
[im 135/450  lung]
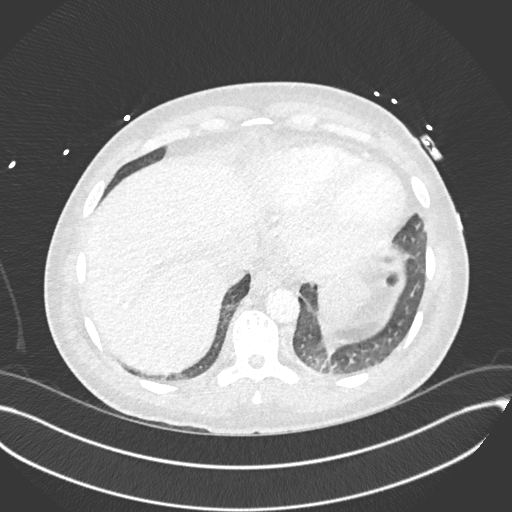
[im 180/450  mediastinal]
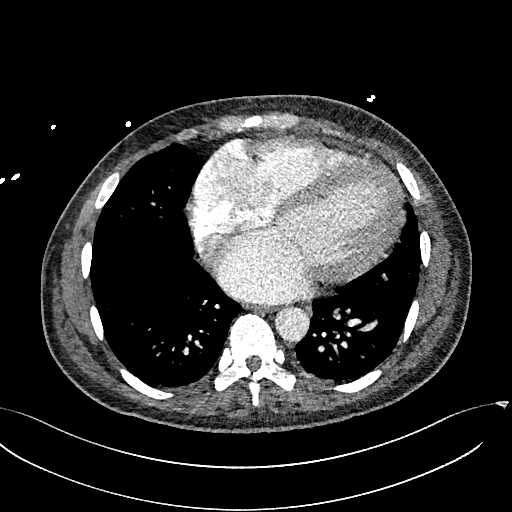
[im 225/450  lung]
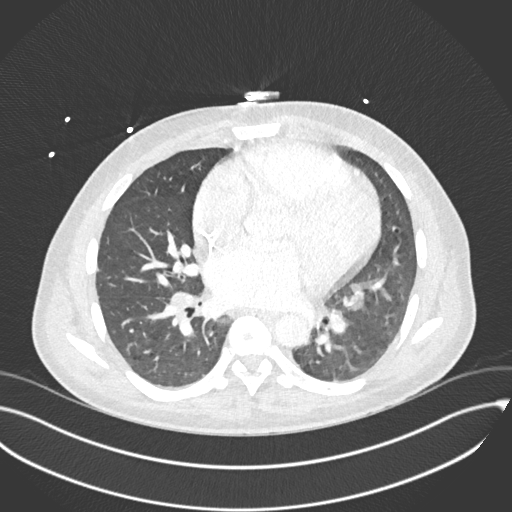
[im 270/450  mediastinal]
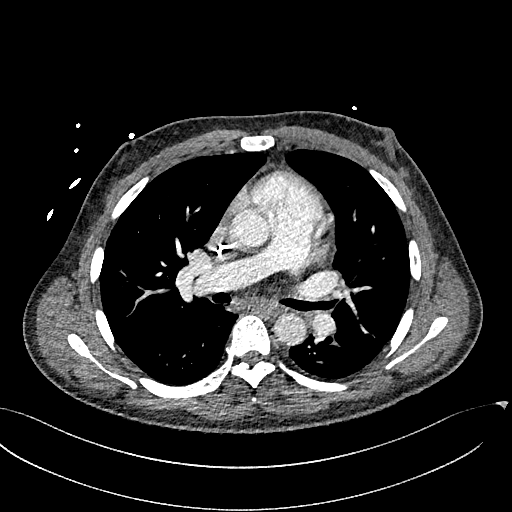
[im 315/450  lung]
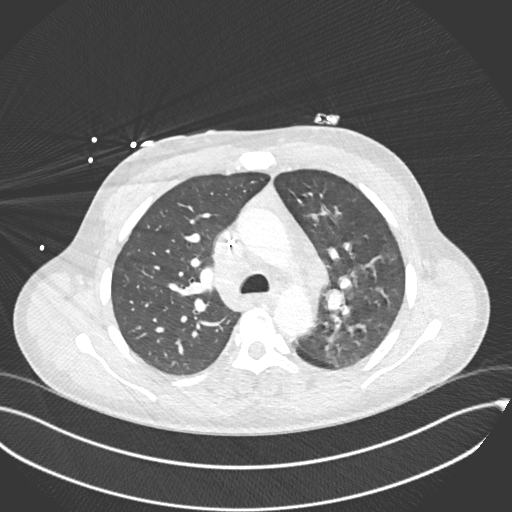
[im 360/450  mediastinal]
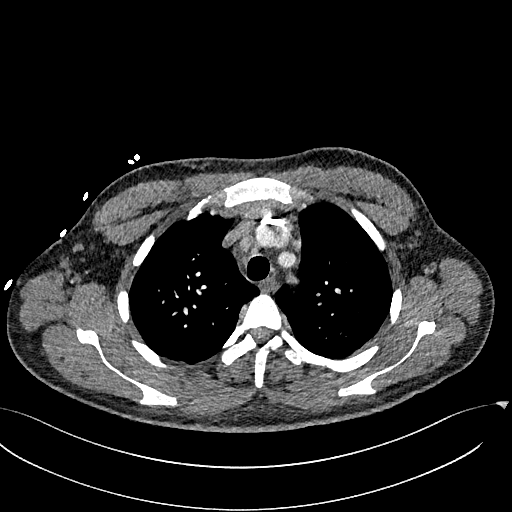
[im 405/450  lung]
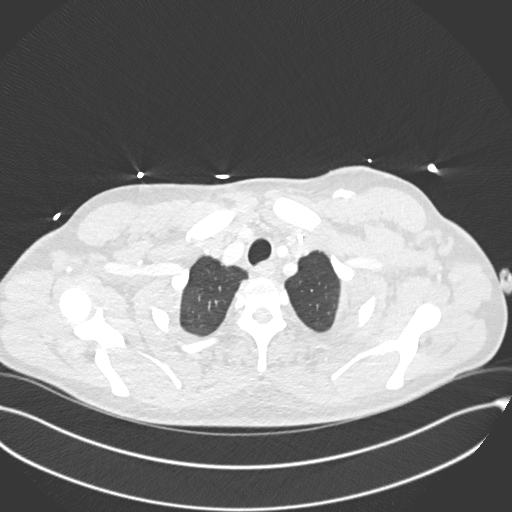

[9 of 38 positions shown; findings below may reference images not displayed]

FINDINGS: Cardiovascular: Somewhat suboptimal opacification of pulmonary
arteries noted, however no pulmonary emboli identified. No evidence
of thoracic aortic dissection or aneurysm. Mild-to-moderate
cardiomegaly.

Mediastinum/Nodes: Mild mediastinal lymphadenopathy is seen in the
right paratracheal region and AP window. Largest lymph node measures
1.6 cm in the AP window on image 49/5. No hilar lymphadenopathy
identified.

Lungs/Pleura: No pulmonary mass, infiltrate, or effusion.

Upper abdomen: No acute findings.

Musculoskeletal: No suspicious bone lesions identified.

Review of the MIP images confirms the above findings.
IMPRESSION: No evidence of pulmonary embolism or active lung disease.

Mild mediastinal lymphadenopathy, which is nonspecific. Recommend
continued follow-up by chest CT in 3-6 months. This recommendation
follows ACR consensus guidelines: Managing Incidental Findings on
Thoracic CT: Mediastinal and Cardiovascular Findings. A White Paper
of the ACR Incidental Findings Committee. [HOSPITAL]. 5516;

Mild-to-moderate cardiomegaly.
